# Patient Record
Sex: Male | Born: 1950
Health system: Southern US, Community
[De-identification: ages and names within clinical notes are randomized; demographics above are authoritative.]

## PROBLEM LIST (undated history)

## (undated) DIAGNOSIS — F32A Depression, unspecified: Secondary | ICD-10-CM

## (undated) DIAGNOSIS — K589 Irritable bowel syndrome without diarrhea: Secondary | ICD-10-CM

## (undated) DIAGNOSIS — G459 Transient cerebral ischemic attack, unspecified: Secondary | ICD-10-CM

## (undated) DIAGNOSIS — N200 Calculus of kidney: Secondary | ICD-10-CM

## (undated) DIAGNOSIS — E785 Hyperlipidemia, unspecified: Secondary | ICD-10-CM

## (undated) DIAGNOSIS — F419 Anxiety disorder, unspecified: Secondary | ICD-10-CM

## (undated) DIAGNOSIS — Z720 Tobacco use: Secondary | ICD-10-CM

## (undated) DIAGNOSIS — I10 Essential (primary) hypertension: Secondary | ICD-10-CM

## (undated) DIAGNOSIS — Z87442 Personal history of urinary calculi: Secondary | ICD-10-CM

## (undated) DIAGNOSIS — R7303 Prediabetes: Secondary | ICD-10-CM

## (undated) DIAGNOSIS — C61 Malignant neoplasm of prostate: Secondary | ICD-10-CM

## (undated) DIAGNOSIS — R972 Elevated prostate specific antigen [PSA]: Secondary | ICD-10-CM

## (undated) DIAGNOSIS — M1990 Unspecified osteoarthritis, unspecified site: Secondary | ICD-10-CM

## (undated) DIAGNOSIS — F329 Major depressive disorder, single episode, unspecified: Secondary | ICD-10-CM

## (undated) DIAGNOSIS — Z972 Presence of dental prosthetic device (complete) (partial): Secondary | ICD-10-CM

## (undated) HISTORY — DX: Major depressive disorder, single episode, unspecified: F32.9

## (undated) HISTORY — DX: Calculus of kidney: N20.0

## (undated) HISTORY — PX: PROSTATE BIOPSY: SHX241

## (undated) HISTORY — DX: Irritable bowel syndrome, unspecified: K58.9

## (undated) HISTORY — PX: OTHER SURGICAL HISTORY: SHX169

## (undated) HISTORY — DX: Tobacco use: Z72.0

## (undated) HISTORY — DX: Depression, unspecified: F32.A

## (undated) HISTORY — DX: Essential (primary) hypertension: I10

## (undated) HISTORY — PX: ROTATOR CUFF REPAIR: SHX139

## (undated) HISTORY — DX: Hyperlipidemia, unspecified: E78.5

---
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
## 2000-01-15 ENCOUNTER — Encounter: Payer: Self-pay | Admitting: Emergency Medicine

## 2000-01-15 ENCOUNTER — Emergency Department (HOSPITAL_COMMUNITY): Admission: EM | Admit: 2000-01-15 | Discharge: 2000-01-16 | Payer: Self-pay | Admitting: Emergency Medicine

## 2000-01-18 ENCOUNTER — Emergency Department (HOSPITAL_COMMUNITY): Admission: EM | Admit: 2000-01-18 | Discharge: 2000-01-18 | Payer: Self-pay | Admitting: Emergency Medicine

## 2000-01-22 ENCOUNTER — Ambulatory Visit (HOSPITAL_COMMUNITY): Admission: RE | Admit: 2000-01-22 | Discharge: 2000-01-22 | Payer: Self-pay | Admitting: Urology

## 2000-01-22 ENCOUNTER — Encounter: Payer: Self-pay | Admitting: Urology

## 2000-03-08 ENCOUNTER — Encounter: Payer: Self-pay | Admitting: Urology

## 2000-03-08 ENCOUNTER — Ambulatory Visit (HOSPITAL_COMMUNITY): Admission: RE | Admit: 2000-03-08 | Discharge: 2000-03-08 | Payer: Self-pay | Admitting: Urology

## 2000-10-13 ENCOUNTER — Encounter: Payer: Self-pay | Admitting: Internal Medicine

## 2000-10-13 ENCOUNTER — Emergency Department (HOSPITAL_COMMUNITY): Admission: EM | Admit: 2000-10-13 | Discharge: 2000-10-13 | Payer: Self-pay | Admitting: Emergency Medicine

## 2000-10-14 ENCOUNTER — Ambulatory Visit (HOSPITAL_COMMUNITY): Admission: RE | Admit: 2000-10-14 | Discharge: 2000-10-14 | Payer: Self-pay | Admitting: Urology

## 2000-10-14 ENCOUNTER — Encounter: Payer: Self-pay | Admitting: Urology

## 2000-10-28 ENCOUNTER — Encounter: Payer: Self-pay | Admitting: Urology

## 2000-10-28 ENCOUNTER — Ambulatory Visit (HOSPITAL_COMMUNITY): Admission: RE | Admit: 2000-10-28 | Discharge: 2000-10-28 | Payer: Self-pay | Admitting: Urology

## 2002-12-26 ENCOUNTER — Observation Stay (HOSPITAL_COMMUNITY): Admission: EM | Admit: 2002-12-26 | Discharge: 2002-12-26 | Payer: Self-pay | Admitting: Emergency Medicine

## 2002-12-26 ENCOUNTER — Encounter: Payer: Self-pay | Admitting: Emergency Medicine

## 2003-06-02 DIAGNOSIS — Z86010 Personal history of colonic polyps: Secondary | ICD-10-CM | POA: Insufficient documentation

## 2004-12-10 ENCOUNTER — Encounter: Admission: RE | Admit: 2004-12-10 | Discharge: 2004-12-10 | Payer: Self-pay | Admitting: Family Medicine

## 2005-03-02 ENCOUNTER — Encounter: Admission: RE | Admit: 2005-03-02 | Discharge: 2005-03-02 | Payer: Self-pay | Admitting: Occupational Medicine

## 2006-05-20 ENCOUNTER — Ambulatory Visit: Payer: Self-pay | Admitting: Family Medicine

## 2006-06-02 ENCOUNTER — Ambulatory Visit: Payer: Self-pay | Admitting: Family Medicine

## 2006-07-05 ENCOUNTER — Ambulatory Visit: Payer: Self-pay | Admitting: Family Medicine

## 2006-07-22 ENCOUNTER — Ambulatory Visit: Payer: Self-pay | Admitting: Family Medicine

## 2006-08-17 ENCOUNTER — Ambulatory Visit: Payer: Self-pay | Admitting: Internal Medicine

## 2006-08-17 LAB — CONVERTED CEMR LAB
Alkaline Phosphatase: 81 units/L (ref 39–117)
Calcium: 9.7 mg/dL (ref 8.4–10.5)
Chloride: 100 meq/L (ref 96–112)
Creatinine, Ser: 0.9 mg/dL (ref 0.4–1.5)
Eosinophils Absolute: 0.2 10*3/uL (ref 0.0–0.6)
GFR calc non Af Amer: 93 mL/min
Glucose, Bld: 95 mg/dL (ref 70–99)
Lymphocytes Relative: 27.8 % (ref 12.0–46.0)
MCHC: 33.1 g/dL (ref 30.0–36.0)
Neutrophils Relative %: 60.1 % (ref 43.0–77.0)
Platelets: 384 10*3/uL (ref 150–400)
Total Bilirubin: 0.6 mg/dL (ref 0.3–1.2)
Total Protein: 7.1 g/dL (ref 6.0–8.3)
WBC: 10 10*3/uL (ref 4.5–10.5)

## 2006-09-06 ENCOUNTER — Ambulatory Visit: Payer: Self-pay | Admitting: Internal Medicine

## 2006-09-06 ENCOUNTER — Encounter (INDEPENDENT_AMBULATORY_CARE_PROVIDER_SITE_OTHER): Payer: Self-pay | Admitting: Specialist

## 2006-10-26 ENCOUNTER — Ambulatory Visit: Payer: Self-pay | Admitting: Family Medicine

## 2007-12-15 ENCOUNTER — Telehealth: Payer: Self-pay | Admitting: Internal Medicine

## 2007-12-23 ENCOUNTER — Ambulatory Visit: Payer: Self-pay | Admitting: Internal Medicine

## 2007-12-23 DIAGNOSIS — K625 Hemorrhage of anus and rectum: Secondary | ICD-10-CM

## 2007-12-23 DIAGNOSIS — F1010 Alcohol abuse, uncomplicated: Secondary | ICD-10-CM | POA: Insufficient documentation

## 2007-12-23 DIAGNOSIS — R1033 Periumbilical pain: Secondary | ICD-10-CM | POA: Insufficient documentation

## 2007-12-26 ENCOUNTER — Encounter: Payer: Self-pay | Admitting: Internal Medicine

## 2007-12-26 ENCOUNTER — Ambulatory Visit: Payer: Self-pay | Admitting: Internal Medicine

## 2007-12-26 LAB — CONVERTED CEMR LAB
ALT: 68 units/L — ABNORMAL HIGH (ref 0–53)
AST: 51 units/L — ABNORMAL HIGH (ref 0–37)
Amylase: 148 units/L — ABNORMAL HIGH (ref 27–131)
CO2: 27 meq/L (ref 19–32)
Calcium: 9.5 mg/dL (ref 8.4–10.5)
Chloride: 101 meq/L (ref 96–112)
GFR calc non Af Amer: 92 mL/min
Hemoglobin: 16.9 g/dL (ref 13.0–17.0)
Lipase: 29 units/L (ref 11.0–59.0)
Lymphocytes Relative: 38.9 % (ref 12.0–46.0)
Monocytes Relative: 7 % (ref 3.0–12.0)
Neutro Abs: 3.8 10*3/uL (ref 1.4–7.7)
Platelets: 403 10*3/uL — ABNORMAL HIGH (ref 150–400)
Potassium: 4.1 meq/L (ref 3.5–5.1)
RBC: 5.7 M/uL (ref 4.22–5.81)
RDW: 13.3 % (ref 11.5–14.6)
Sodium: 138 meq/L (ref 135–145)

## 2007-12-29 ENCOUNTER — Telehealth: Payer: Self-pay | Admitting: Internal Medicine

## 2008-01-31 ENCOUNTER — Ambulatory Visit: Payer: Self-pay | Admitting: Internal Medicine

## 2008-04-02 ENCOUNTER — Ambulatory Visit: Payer: Self-pay | Admitting: Family Medicine

## 2008-04-04 ENCOUNTER — Encounter: Admission: RE | Admit: 2008-04-04 | Discharge: 2008-04-04 | Payer: Self-pay | Admitting: Family Medicine

## 2009-09-23 ENCOUNTER — Encounter (INDEPENDENT_AMBULATORY_CARE_PROVIDER_SITE_OTHER): Payer: Self-pay | Admitting: *Deleted

## 2010-06-30 ENCOUNTER — Ambulatory Visit
Admission: RE | Admit: 2010-06-30 | Discharge: 2010-06-30 | Payer: Self-pay | Source: Home / Self Care | Attending: Internal Medicine | Admitting: Internal Medicine

## 2010-06-30 ENCOUNTER — Other Ambulatory Visit: Payer: Self-pay | Admitting: Internal Medicine

## 2010-06-30 ENCOUNTER — Encounter: Payer: Self-pay | Admitting: Internal Medicine

## 2010-06-30 DIAGNOSIS — R143 Flatulence: Secondary | ICD-10-CM

## 2010-06-30 DIAGNOSIS — R141 Gas pain: Secondary | ICD-10-CM | POA: Insufficient documentation

## 2010-06-30 DIAGNOSIS — F411 Generalized anxiety disorder: Secondary | ICD-10-CM | POA: Insufficient documentation

## 2010-06-30 DIAGNOSIS — Z860100 Personal history of colon polyps, unspecified: Secondary | ICD-10-CM | POA: Insufficient documentation

## 2010-06-30 DIAGNOSIS — R142 Eructation: Secondary | ICD-10-CM

## 2010-06-30 DIAGNOSIS — K5730 Diverticulosis of large intestine without perforation or abscess without bleeding: Secondary | ICD-10-CM | POA: Insufficient documentation

## 2010-06-30 DIAGNOSIS — F329 Major depressive disorder, single episode, unspecified: Secondary | ICD-10-CM | POA: Insufficient documentation

## 2010-06-30 DIAGNOSIS — K649 Unspecified hemorrhoids: Secondary | ICD-10-CM | POA: Insufficient documentation

## 2010-06-30 DIAGNOSIS — G4730 Sleep apnea, unspecified: Secondary | ICD-10-CM | POA: Insufficient documentation

## 2010-06-30 LAB — COMPREHENSIVE METABOLIC PANEL
ALT: 34 U/L (ref 0–53)
Alkaline Phosphatase: 67 U/L (ref 39–117)
BUN: 10 mg/dL (ref 6–23)
Calcium: 9.4 mg/dL (ref 8.4–10.5)
Chloride: 102 mEq/L (ref 96–112)
Sodium: 137 mEq/L (ref 135–145)
Total Bilirubin: 0.7 mg/dL (ref 0.3–1.2)
Total Protein: 7 g/dL (ref 6.0–8.3)

## 2010-06-30 LAB — CBC WITH DIFFERENTIAL/PLATELET
Basophils Absolute: 0 10*3/uL (ref 0.0–0.1)
Basophils Relative: 0.5 % (ref 0.0–3.0)
HCT: 45.9 % (ref 39.0–52.0)
Hemoglobin: 15.6 g/dL (ref 13.0–17.0)
MCV: 83.8 fl (ref 78.0–100.0)
Neutro Abs: 6.1 10*3/uL (ref 1.4–7.7)

## 2010-07-01 NOTE — Letter (Signed)
Summary: Colonoscopy Letter  [facility]  [address] [place], [place] [address]   Phone: [phone]  Fax: [phone]      September 23, 2009 MRN: [id number]   [facility] [address] [place], [place]  [address]   Dear Mr. Jarrard,   According to your medical record, it is time for you to schedule a Colonoscopy. The American Cancer Society recommends this procedure as a method to detect early colon cancer. Patients with a family history of colon cancer, or a personal history of colon polyps or inflammatory bowel disease are at increased risk.  This letter has beeen generated based on the recommendations made at the time of your procedure. If you feel that in your particular situation this may no longer apply, please contact our office.  Please call our office at [phone] to schedule this appointment or to update your records at your earliest convenience.  Thank you for cooperating with [place] to provide you with the very best care possible.   Sincerely,  Iva Boop, M.D.  [facility] Gastroenterology Division [phone]

## 2010-07-09 NOTE — Assessment & Plan Note (Signed)
Summary: abd pain--ch.    History of Present Illness Visit Type: Follow-up Visit Primary GI MD: Stan Head MD [facility] Primary Provider: Sharlot Gowda MD Requesting Provider: na Chief Complaint: lower abd pain  History of Present Illness:   60 yo African-American man c/o chronic mostly nocturnal borborygmi and bloating that interferes with sleep. He will also have sharp pains also, short-lived but bothersome. No daytime issues. Has irregular defecation, rare rectal bleeding on stool and toilet paper (chronic). Diet: 4 cups cofee in Am then an evening eal. Not mch other eating.   GI Review of Systems    Reports abdominal pain and  bloating.     Location of  Abdominal pain: lower abdomen.    Denies acid reflux, belching, chest pain, dysphagia with liquids, dysphagia with solids, heartburn, loss of appetite, nausea, vomiting, vomiting blood, weight loss, and  weight gain.        Denies anal fissure, black tarry stools, change in bowel habit, constipation, diarrhea, diverticulosis, fecal incontinence, heme positive stool, hemorrhoids, irritable bowel syndrome, jaundice, light color stool, liver problems, rectal bleeding, and  rectal pain. Preventive Screening-Counseling & Management  Alcohol-Tobacco     Alcohol drinks/day: <1     Smoking Status: current     Smoking Cessation Counseling: yes     Smoke Cessation Stage: precontemplative     Tobacco Counseling: not indicated; no tobacco use  Caffeine-Diet-Exercise     Caffeine use/day: 4     Caffeine Counseling: decrease use of caffeine     Diet Comments: inadequate     Diet Counseling: to improve diet; diet is suboptimal     Does Patient Exercise: yes     Type of exercise: walking, active jon     Times/week: 5     Exercise Counseling: not indicated; exercise is adequate    Current Medications (verified): 1)  None  Allergies (verified): No Known Drug Allergies  Past History:  Past Medical History: Anxiety  Disorder Adenomatous Colon Polyps Depression Sleep Apnea Diverticulosis Hemorrhoids  Past Surgical History: Reviewed history from 12/23/2007 and no changes required. Unremarkable  Family History: Alcoholism: Father Family History of Breast Cancer: Mother, Sister Family History of Prostate Cancer: Father, Uncle Family History of Heart Disease: Father No FH of Colon Cancer:  Social History: Occupation: retired Patient currently smokes. 1 ppd Alcohol Use - only every 2 weeks now "a little red wine" Daily Caffeine Use 4 per day Illicit Drug Use - no Patient does not get regular exercise.  Caffeine use/day:  4 Diet Comments:  inadequate Does Patient Exercise:  yes Alcohol drinks/day:  <1   Vital Signs:  Patient profile:   60 year old male Height:      67 inches Weight:      161 pounds BMI:     25.31 BSA:     1.85 Pulse rate:   76 / minute Pulse rhythm:   regular BP sitting:   126 / 74  (left arm) Cuff size:   regular  Vitals Entered By: Ok Anis CMA (June 30, 2010 9:42 AM)  Physical Exam  General:  Well developed, well nourished, no acute distress. Eyes:  anicteric Lungs:  Clear throughout to auscultation. Heart:  Regular rate and rhythm; no murmurs, rubs,  or bruits. Abdomen:  Soft, nontender and nondistended. No masses, hepatosplenomegaly or hernias noted. Normal bowel sounds. Rectal:  deferred until time of colonoscopy.   Cervical Nodes:  No significant cervical or supraclavicular adenopathy.  Psych:  Alert and cooperative. Normal mood  and affect.   Impression & Recommendations:  Problem # 1:  ABDOMINAL PAIN-PERIUMBILICAL (ICD-789.05) Assessment Unchanged Similar to what he has had in past. Sounds like it is IBS. He reports greatly reducing alcohol so must not be that. Needs colonoscopy for screening and surveillance. Once that is done and labs reviewed, weill make recommendations. Have already given gas and flatulence prevention diet.  Problem #  2:  FLATULENCE ERUCTATION AND GAS PAIN (ICD-787.3) Assessment: Unchanged lIO am suspecting IBS. Diet changed to start (gas and flatulence reduction). Await screening/surveillance colonoscopy also.  Problem # 3:  ? of IRRITABLE BOWEL SYNDROME (ICD-564.1) Assessment: New  Problem # 4:  COLONIC POLYPS, ADENOMATOUS, HX OF (ICD-V12.72) Risks, benefits,and indications of endoscopic procedure(s) were reviewed with the patient and all questions answered.  Orders: Colonoscopy (Colon)  Problem # 5:  SPECIAL SCREENING FOR MALIGNANT NEOPLASMS COLON (ICD-V76.51) Assessment: Comment Only  Other Orders: TLB-TSH (Thyroid Stimulating Hormone) (84443-TSH) TLB-CMP (Comprehensive Metabolic Pnl) (80053-COMP) TLB-CBC Platelet - w/Differential (85025-CBCD)  Patient Instructions: 1)  Copy sent to : Sharlot Gowda MD 2)  You need to stop smoking, as we discussed. 3)  Your physician has requested that you have the following labwork done today: CBC, CMET, TSH 4)  You will go to the basement for labs today 5)  You colonoscopy is scheduled for 07/25/2010 at 3:30pm 6)  You will need to pick up your MoviPrep at your pharmacy today 7)  Gas and Flatulence diet given 8)  The medication list was reviewed and reconciled.  All changed / newly prescribed medications were explained.  A complete medication list was provided to the patient / caregiver.

## 2010-07-09 NOTE — Letter (Signed)
Summary: [facility] Instructions  [facility]  [address] [place], [place] [address]   Phone: [phone]  Fax: [phone]       Connor Stevens    May 02, 1951    MRN: [id number]        Procedure Day /Date:FRIDAY 07/25/2010     Arrival Time:2:30PM     Procedure Time:3:30PM     Location of Procedure:                    X  [facility] (4th Floor)                        PREPARATION FOR COLONOSCOPY WITH MOVIPREP   Starting 5 days prior to your procedure 07/20/2010  do not eat nuts, seeds, popcorn, corn, beans, peas,  salads, or any raw vegetables.  Do not take any fiber supplements (e.g. Metamucil, Citrucel, and Benefiber).  THE DAY BEFORE YOUR PROCEDURE         DATE:07/24/2010  DAY: THURSDAY  1.  Drink clear liquids the entire day-NO SOLID FOOD  2.  Do not drink anything colored red or purple.  Avoid juices with pulp.  No orange juice.  3.  Drink at least 64 oz. (8 glasses) of fluid/clear liquids during the day to prevent dehydration and help the prep work efficiently.  CLEAR LIQUIDS INCLUDE: Water Jello Ice Popsicles Tea (sugar ok, no milk/cream) Powdered fruit flavored drinks Coffee (sugar ok, no milk/cream) Gatorade Juice: apple, white grape, white cranberry  Lemonade Clear bullion, consomm, broth Carbonated beverages (any kind) Strained chicken noodle soup Hard Candy                             4.  In the morning, mix first dose of MoviPrep solution:    Empty 1 Pouch A and 1 Pouch B into the disposable container    Add lukewarm drinking water to the top line of the container. Mix to dissolve    Refrigerate (mixed solution should be used within 24 hrs)  5.  Begin drinking the prep at 5:00 p.m. The MoviPrep container is divided by 4 marks.   Every 15 minutes drink the solution down to the next mark (approximately 8 oz) until the full liter is complete.   6.  Follow completed prep with 16 oz of clear liquid of your choice (Nothing red  or purple).  Continue to drink clear liquids until bedtime.  7.  Before going to bed, mix second dose of MoviPrep solution:    Empty 1 Pouch A and 1 Pouch B into the disposable container    Add lukewarm drinking water to the top line of the container. Mix to dissolve    Refrigerate  THE DAY OF YOUR PROCEDURE      DATE:07/25/2010  DAY: FRIDAY  Beginning at 10:30a.m. (5 hours before procedure):         1. Every 15 minutes, drink the solution down to the next mark (approx 8 oz) until the full liter is complete.  2. Follow completed prep with 16 oz. of clear liquid of your choice.    3. You may drink clear liquids until 1:30PM  (2 HOURS BEFORE PROCEDURE).   MEDICATION INSTRUCTIONS  Unless otherwise instructed, you should take regular prescription medications with a small sip of water   as early as possible the morning of your procedure.  OTHER INSTRUCTIONS  You will need a responsible adult at least 60 years of age to accompany you and drive you home.   This person must remain in the waiting room during your procedure.  Wear loose fitting clothing that is easily removed.  Leave jewelry and other valuables at home.  However, you may wish to bring a book to read or  an iPod/MP3 player to listen to music as you wait for your procedure to start.  Remove all body piercing jewelry and leave at home.  Total time from sign-in until discharge is approximately 2-3 hours.  You should go home directly after your procedure and rest.  You can resume normal activities the  day after your procedure.  The day of your procedure you should not:   Drive   Make legal decisions   Operate machinery   Drink alcohol   Return to work  You will receive specific instructions about eating, activities and medications before you leave.    The above instructions have been reviewed and explained to me by   _______________________    I fully understand and can verbalize these  instructions _____________________________ Date _________

## 2010-07-25 ENCOUNTER — Other Ambulatory Visit: Payer: Self-pay | Admitting: Internal Medicine

## 2010-10-17 NOTE — H&P (Signed)
NAME:  OVE, AMAT NO.:  [id number]   MEDICAL RECORD NO.:  [id number]                   PATIENT TYPE:  INP   LOCATION:  0105                                 FACILITY:  [facility]   PHYSICIAN:  Elliot Cousin, M.D.                 DATE OF BIRTH:  Jun 04, 1950   DATE OF ADMISSION:  12/26/2002  DATE OF DISCHARGE:                                HISTORY & PHYSICAL   CHIEF COMPLAINT:  Substernal chest pain.   HISTORY OF PRESENT ILLNESS:  Mr. Morita is a 60 year old man who has a history  of depression and anxiety who presented to the emergency department today of  chest pain while at work.  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  He  rated the pain as an 8/10 in intensity.  The pain is described as tight,  indigestion-like, and pressure like.  The pain radiated to both shoulders  and also to the epigastric region.  He had no associated lightheadedness or  nausea.  However, he did have an episode of diaphoresis.  The patient also  had an episode of chest pain approximately one week ago, again while at  work.  He was standing up at the time.  His pain was described then as a  sharp pain, and he became lightheaded during that episode but no other  associated symptoms.  The pain lasted approximately 10 minutes last week and  then it resolved spontaneously.  The chest pain today lasted approximately  15-20 minutes, then it subsided on his way to the emergency department.   The patient has a recent history of an upper respiratory infection with a  productive cough of green sputum and subjective fevers and chills.  However,  he said that he was over this.  He has no complaints of pleurisy, no  complaints of heartburn and no complaints of chest wall muscle pain.  However, he states that he has been under a lot more stress lately.  He  takes Zoloft for depression and anxiety.  Currently the  patient is chest  pain-free.   REVIEW OF SYSTEMS:  The patient's review of systems is positive for recent  upper respiratory symptoms, subjective fever and chills, anxiety, mild  depression, and occasional urinary hesitancy.  His review of systems is  negative for headache, visual changes, weight loss, shortness of breath,  abdominal pain, nausea, vomiting, diarrhea, blood in the stools or painful  urination.   PAST MEDICAL HISTORY:  1. Depression.  No history of suicidal attempts.  2. History of benign prostatic hypertrophy.  3. History of left kidney stone.  4. History of colon polyps, status post polypectomy x2 in the past.   The patient has no history of hypertension, diabetes, cancer or lung  disease.   MEDICATIONS:  Zoloft  50 mg every day.   ALLERGIES:  No known drug allergies.   FAMILY HISTORY:  The patient has no family history of heart disease.  His  father is 5 years old, currently living and having kidney failure, bone  cancer and lung cancer.  His mother died of breast cancer at 12 years of  age.  He originally had seven brothers and two sisters.  One brother died of  a stroke, and the second brother died of lung cancer.   SOCIAL HISTORY:  The patient is divorced.  He lives in [place].  He has  two children.  He is employed full time.  He drinks alcohol approximately  two shots of scotch and beer four to five days per week.  He smokes one pack  of cigarettes per day, and he has been doing so for 30 years.  He denies  illicit drug use.   PHYSICAL EXAMINATION:  VITAL SIGNS:  Temperature is 98.0, pulse is 69,  respiratory rate 18, blood pressure 134/93.  Oxygen saturation on room air  100%.  GENERAL:  The patient is a pleasant middle age [place] American man who is  lying in bed in no acute distress.  HEENT:  Head is normocephalic, atraumatic.  Pupils are equal, round and  reactive to light.  Extraocular movements are intact.  Conjunctivae are  clear.  Sclerae  are white.  Tympanic membranes are clear bilaterally.  Nasal  mucosa is moist without any drainage.  Oropharynx reveals moist mucous  membranes, no posterior exudates or erythema.  Fair dentition.  NECK:  Supple.  No adenopathy.  No thyromegaly, no bruits.  LUNGS:  Clear to auscultation bilaterally.  HEART:  S1 and S2 with no murmurs, rubs or gallops.  ABDOMEN:  Slightly obese, positive bowel sounds, soft, nontender,  nondistended.  No hepatosplenomegaly.  RECTAL/GENITOURINARY:  Deferred.  EXTREMITIES:  The patient has 2+ pedal pulses bilaterally.  No pretibial  edema.  NEUROLOGICAL:  The patient is alert and oriented x 3.  Cranial nerves II-XII  are intact.  Strength is 5/5 throughout.  Sensation is intact throughout.  PSYCHOLOGICAL:  The patient is pleasant, alert and oriented.  However, he  does appear slightly anxious.   LABORATORY DATA:  Initial laboratories:  EKG normal sinus rhythm without any  acute changes.  Chest x-ray:  No acute abnormalities.  CK:  276, CK-MB 3.4,  index 1.2 (0-2.5 within normal limits).  Troponin-I 0.03.  Lipase 19.  WBC  6.5, hemoglobin 14.9, hematocrit 44.4, MCV 82.4, platelets 319,000.  Sodium  139, potassium 4.8, chloride 103, CO2 29, glucose 111, BUN 9, creatinine  1.1, calcium 9.5, total protein 7.2, albumin 4.4, AST 29, ALT 33, alkaline  phosphatase 57, total bilirubin 0.9.   ASSESSMENT:  1. Substernal chest pain.  The chest pain is atypical for  angina, however,     the patient does have significant risk factor and tobacco abuse.  He does     not have a strong family history of heart disease, nor does he have a     history of high blood pressure or hyperlipidemia per his history.     Consider anxiety/stress as an etiology, gastroesophageal reflux disease,     and chest wall pain as other etiologies.  Less likely pulmonary embolism     given the patient is oxygenating 100% on room air.  He has no pleurisy.    The patient's first set of cardiac  enzymes are relatively unremarkable  with the exception of a total CK that was mildly elevated at 276.  2. Tobacco abuse and alcohol abuse.   PLAN:  1. The patient will be admitted for 24 hour observation to rule out     myocardial infarction.  We will obtain a cardiology consult for possible     inpatient versus outpatient stress testing.  The patient will be observed     on the telemetry bed.  Cardiac enzymes will be collected every 8 hours     x3.  2. Sublingual nitroglycerin 0.4 mg sublingually every 5 minutes x3 as needed     for chest pain, morphine 2 mg IV q.4h. as needed for pain and aspirin 81     mg every day.  3. Will add empiric Protonics 40 mg every day and Mylanta 15 mL every four     hours as needed for possible reflux.  4. We will continue Zoloft at 50 mg every day and add Xanax 0.25 mg every 6     hours as needed for anxiety and     sleep.  5. Obtain a fasting lipid panel in the morning.  6. Advise the patient to reduce his intake of alcohol and/or stop his     tobacco use.                                               Elliot Cousin, M.D.    DF/MEDQ  D:  12/26/2002  T:  12/26/2002  Job:  [id number]   cc:   Sharlot Gowda, M.D.  [address] [place], [place] [address]  Fax: [phone]

## 2010-10-17 NOTE — Assessment & Plan Note (Signed)
[name]                         GASTROENTEROLOGY OFFICE NOTE   Connor Stevens, Connor Stevens                           MRN:          [id number]  DATE:08/17/2006                            DOB:          1950-07-04    CHIEF COMPLAINT:  Stomach pain.   HISTORY:  [age]-year-old African American man that has several  complaints.  He has developed some infraumbilical and periumbilical  abdominal pain described as gas.  He passes a lot of flatus and feels  somewhat better.  It is worse when he lies down.  He quit drinking about  2 weeks ago and symptoms are a little bit less, but still a problem.  Two months ago, he had some dark red blood when he was wiping, or it was  in his bowel movement.  He has not had melena of which I am aware.  The  abdominal pain is relatively constant, not made worse by drinking or  eating, etc.  Again, he feels somewhat better since stopping alcohol.  He has been a regular drinker of liquor and beer everyday, perhaps about  8 ounces of liquor everyday it sounds like and a couple of beers.  He  has been under a lot of stress lately and wonders if there is a  relation.  He retired in December and is planning on looking for some  other work.  He was on Zoloft 25 mg daily for premature ejaculation,  coordinated through Dr. Brunilda Payor and Dr. Susann Givens, increased up to 100 mg a  day in December.  He has constipation more so than loose stools, though  he can have both.  He uses some Tums for heartburn at times.  This  morning, he noted some right-sided chest wall pain after bending over;  it sounds like he has been doing some toe-touches.  He has been trying  to exercise a little bit and he has some lower back tenderness as well.   PAST MEDICAL HISTORY:  1. Multiple colonoscopies for adenomatous colon polyps; last one was      in 2005 with an 8-mm adenomatous polyp removed and another one      cauterized to destruction, which was about 4 mm (Dr.  Corinda Gubler).  He      has had polyps throughout the years, dating back to procedure of      1989, when he first met Dr. Corinda Gubler.  2. Anxiety.  3. Depression, relatively new.  4. He indicates a diagnosis of sleep apnea.   MEDICATIONS:  1. Sertraline 100 mg daily.  2. Multivitamin daily.  3. Zolpidem p.r.n. insomnia.   ALLERGIES:  None known.   FAMILY HISTORY:  An uncle and a father had prostate cancer.  Mother and  sister had breast cancer.  Father was an alcoholic and father had heart  disease.   SOCIAL HISTORY:  The patient is retired from the post [employer].  He is  divorced.  He has 2 sons.  He smokes a pack per day; he has been  counseled as to the hazards of that and the need to quit.  Alcohol:  Pretty regular and steady for a number of years, as described.  He is a  Teaching laboratory technician as well.   REVIEW OF SYSTEMS:  Positive for insomnia and those things mentioned  above.   PHYSICAL EXAMINATION:  A well-developed, middle-aged black male in no  acute distress.  Height 5 feet 7 inches, weight 178 pounds.  Pulse 84 and regular.  Blood  pressure 114/70.  HEENT:  The eyes are anicteric.  EOMI.  PERRLA.  Sclerae are anicteric.  Conjunctivae are pink.  Teeth in fair repair.  NECK:  Supple without thyromegaly or mass.  CHEST:  Clear.  CARDIAC:  S1 and S2.  There are no murmurs, gallops or rubs.  ABDOMEN:  Soft, minimally tender to the right of the umbilicus.  No  hepatosplenomegaly.  Bowel sounds are normoactive.  RECTAL:  Deferred.  LYMPHATICS:  No neck or supraclavicular lymphadenopathy.  EXTREMITIES:  No edema.  SKIN:  No acute rash.  NEUROLOGIC/PSYCHIATRIC:  Alert and oriented x3.   ASSESSMENT:  This man has had some rectal bleeding and blood in the  stool.  He has periumbilical, mainly infraumbilical pain and  gaseousness.  He has perhaps some change in bowel habits.  There is some  mild heartburn symptomatology noted as well.  This is in the background  of a history of  adenomatous colon polyps over the years as well as  recent anxiety and depressive symptomatology and stressors and a major  life change of retirement.   PLAN:  1. Check a colonoscopy, given the bleeding and the history of polyps;      I want to exclude any serious causes such as colorectal cancer.  2. CBC and a CMET are checked today.  The patient has been a regular      drinker and I want to make sure he does not have alcoholic      hepatitis, though he does not seem to have hepatomegaly or anything      like that that would be worthwhile checking for.  3. Consider proton pump inhibitor.  4. Trial of Levsin SL p.r.n., #60, no refills prescribed pending the      results of the colonoscopy.   I appreciate the opportunity to care for this patient.     Iva Boop, MD,FACG  Electronically Signed    CEG/MedQ  DD: 08/17/2006  DT: 08/18/2006  Job #: [id number]

## 2010-10-27 ENCOUNTER — Emergency Department (HOSPITAL_COMMUNITY)
Admission: EM | Admit: 2010-10-27 | Discharge: 2010-10-27 | Disposition: A | Discharge status: Home / Self Care | Payer: 59 | Attending: Emergency Medicine | Admitting: Emergency Medicine

## 2010-10-27 ENCOUNTER — Emergency Department (HOSPITAL_COMMUNITY): Payer: 59

## 2010-10-27 DIAGNOSIS — R059 Cough, unspecified: Secondary | ICD-10-CM | POA: Insufficient documentation

## 2010-10-27 DIAGNOSIS — R51 Headache: Secondary | ICD-10-CM | POA: Insufficient documentation

## 2010-10-27 DIAGNOSIS — R05 Cough: Secondary | ICD-10-CM | POA: Insufficient documentation

## 2010-10-27 DIAGNOSIS — R079 Chest pain, unspecified: Secondary | ICD-10-CM | POA: Insufficient documentation

## 2010-10-27 DIAGNOSIS — J40 Bronchitis, not specified as acute or chronic: Secondary | ICD-10-CM | POA: Insufficient documentation

## 2010-10-27 DIAGNOSIS — J3489 Other specified disorders of nose and nasal sinuses: Secondary | ICD-10-CM | POA: Insufficient documentation

## 2010-10-27 DIAGNOSIS — R0602 Shortness of breath: Secondary | ICD-10-CM | POA: Insufficient documentation

## 2012-10-28 ENCOUNTER — Ambulatory Visit: Payer: Self-pay

## 2012-11-06 ENCOUNTER — Encounter (HOSPITAL_COMMUNITY): Payer: Self-pay

## 2012-11-06 ENCOUNTER — Emergency Department (HOSPITAL_COMMUNITY)
Admission: EM | Admit: 2012-11-06 | Discharge: 2012-11-06 | Disposition: A | Discharge status: Home / Self Care | Payer: 59 | Attending: Emergency Medicine | Admitting: Emergency Medicine

## 2012-11-06 ENCOUNTER — Emergency Department (HOSPITAL_COMMUNITY): Payer: 59

## 2012-11-06 DIAGNOSIS — Z791 Long term (current) use of non-steroidal anti-inflammatories (NSAID): Secondary | ICD-10-CM | POA: Insufficient documentation

## 2012-11-06 DIAGNOSIS — M549 Dorsalgia, unspecified: Secondary | ICD-10-CM | POA: Insufficient documentation

## 2012-11-06 LAB — URINALYSIS, ROUTINE W REFLEX MICROSCOPIC
Ketones, ur: NEGATIVE mg/dL
Leukocytes, UA: NEGATIVE
Nitrite: NEGATIVE
Protein, ur: NEGATIVE mg/dL
Urobilinogen, UA: 1 mg/dL (ref 0.0–1.0)

## 2012-11-06 LAB — CBC WITH DIFFERENTIAL/PLATELET
Basophils Absolute: 0.1 10*3/uL (ref 0.0–0.1)
Basophils Relative: 1 % (ref 0–1)
Eosinophils Absolute: 0.2 10*3/uL (ref 0.0–0.7)
Lymphs Abs: 3 10*3/uL (ref 0.7–4.0)
MCH: 26.5 pg (ref 26.0–34.0)
MCHC: 32.6 g/dL (ref 30.0–36.0)
Neutrophils Relative %: 54 % (ref 43–77)
Platelets: 315 10*3/uL (ref 150–400)
RBC: 5.32 MIL/uL (ref 4.22–5.81)
RDW: 14.5 % (ref 11.5–15.5)

## 2012-11-06 LAB — COMPREHENSIVE METABOLIC PANEL
AST: 33 U/L (ref 0–37)
CO2: 27 mEq/L (ref 19–32)
Calcium: 9.9 mg/dL (ref 8.4–10.5)
Creatinine, Ser: 0.87 mg/dL (ref 0.50–1.35)
GFR calc Af Amer: >90 mL/min (ref >90)
GFR calc non Af Amer: >90 mL/min (ref >90)
Sodium: 138 mEq/L (ref 135–145)
Total Protein: 7.4 g/dL (ref 6.0–8.3)

## 2012-11-06 MED ORDER — NAPROXEN 500 MG PO TABS: 500.0000 mg | ORAL_TABLET | Freq: Two times a day (BID) | ORAL | Status: DC

## 2012-11-06 MED ORDER — OXYCODONE-ACETAMINOPHEN 7.5-325 MG PO TABS: 1.0000 | ORAL_TABLET | ORAL | Status: DC | PRN

## 2012-11-06 NOTE — ED Provider Notes (Signed)
History     CSN: [id number]  Arrival date & time 11/06/12  1537   First MD Initiated Contact with Patient 11/06/12 1548      Chief Complaint  Patient presents with  . Flank Pain    (Consider location/radiation/quality/duration/timing/severity/associated sxs/prior treatment) HPI Patient presents to emergency room with 2-3 week history of increasing back pain associated with muscle spasms.  Pain seems to also radiate down into his thighs.  Pain is made much worse with movement bending or stooping.  Patient's had no fever or chills. Was seen at Urgent care and prescribed medications which hasn't helped much.  Patient has history of alcohol use.  Was told he had abnormal liver enzymes and to stop drinking alcohol.  History reviewed. No pertinent past medical history.  History reviewed. No pertinent past surgical history.  History reviewed. No pertinent family history.  History  Substance Use Topics  . Smoking status: Not on file  . Smokeless tobacco: Not on file  . Alcohol Use: Not on file      Review of Systems All other systems reviewed and are negative  Allergies  Review of patient's allergies indicates no known allergies.  Home Medications   Current Outpatient Rx  Name  Route  Sig  Dispense  Refill  . cyclobenzaprine (FLEXERIL) 10 MG tablet   Oral   Take 10 mg by mouth 2 (two) times daily as needed for muscle spasms.         . Multiple Vitamin (MULTIVITAMIN WITH MINERALS) TABS   Oral   Take 1 tablet by mouth daily.         . naproxen (NAPROSYN) 500 MG tablet   Oral   Take 1 tablet (500 mg total) by mouth 2 (two) times daily.   30 tablet   0   . oxyCODONE-acetaminophen (PERCOCET) 7.5-325 MG per tablet   Oral   Take 1 tablet by mouth every 4 (four) hours as needed for pain.   30 tablet   0     BP 132/82  Pulse 79  Temp(Src) 98.2 F (36.8 C) (Oral)  Resp 18  SpO2 100%  Physical Exam  Nursing note and vitals reviewed. Constitutional: He is  oriented to person, place, and time. He appears well-developed and well-nourished. No distress.  HENT:  Head: Normocephalic and atraumatic.  Eyes: Pupils are equal, round, and reactive to light.  Neck: Normal range of motion.  Cardiovascular: Normal rate and intact distal pulses.   Pulmonary/Chest: No respiratory distress.  Abdominal: Normal appearance. He exhibits no distension, no pulsatile liver, no fluid wave and no abdominal bruit. There is no tenderness. There is no rigidity, no rebound and no guarding.  Musculoskeletal:       Back:  Pain with movement or bending.  Some pain with palpation.  Neurological: He is alert and oriented to person, place, and time. No cranial nerve deficit.  Skin: Skin is warm and dry. No rash noted.  Psychiatric: He has a normal mood and affect. His behavior is normal.    ED Course  Procedures (including critical care time)  Labs Reviewed  URINALYSIS, ROUTINE W REFLEX MICROSCOPIC - Abnormal; Notable for the following:    Color, Urine AMBER (*)    All other components within normal limits  CBC WITH DIFFERENTIAL  COMPREHENSIVE METABOLIC PANEL   Dg Lumbar Spine Complete  11/06/2012   *RADIOLOGY REPORT*  Clinical Data: Low back pain radiating into both lower extremities.  LUMBAR SPINE - COMPLETE 4+ VIEW  Comparison:  None.  Findings: Mild spondylosis of the lumbar spine present with osteophyte formation at multiple levels and facet hypertrophy at multiple levels.  There is no evidence of fracture, listhesis or bony lesion.  Disc space heights are relatively well preserved with no significant disc space narrowing present.  IMPRESSION: Mild lumbar spondylosis.  No acute findings.   Original Report Authenticated By: Irish Lack, M.D.     1. Back pain       MDM          Nelia Shi, MD 11/07/12 [phone]

## 2012-11-06 NOTE — ED Notes (Signed)
He c/o bilat. Flank pain radiating into back x 3 weeks.  He was seen at a minor emerg. And prescribed meds which are not helping.  He states sometimes he has "spasms" during which he feels the need to have a b.m., but is unable to.  He states he had a sm. Formed stool this afternoon.  He denies fever/n/v/dysuria.

## 2012-12-14 ENCOUNTER — Encounter: Payer: Self-pay | Admitting: Internal Medicine

## 2013-03-14 ENCOUNTER — Encounter: Payer: Self-pay | Admitting: Neurology

## 2013-03-15 ENCOUNTER — Ambulatory Visit (INDEPENDENT_AMBULATORY_CARE_PROVIDER_SITE_OTHER): Payer: 59 | Admitting: Neurology

## 2013-03-15 ENCOUNTER — Encounter (INDEPENDENT_AMBULATORY_CARE_PROVIDER_SITE_OTHER): Payer: Self-pay

## 2013-03-15 ENCOUNTER — Encounter: Payer: Self-pay | Admitting: Neurology

## 2013-03-15 VITALS — BP 143/81 | HR 73 | Ht 66.0 in | Wt 162.0 lb

## 2013-03-15 DIAGNOSIS — R209 Unspecified disturbances of skin sensation: Secondary | ICD-10-CM | POA: Insufficient documentation

## 2013-03-15 NOTE — Progress Notes (Signed)
Reason for visit: Left-sided numbness, jerking  Connor Stevens is a 62 y.o. male  History of present illness:  Connor Stevens is a 62 year old right-handed black male with a history of hypertension and dyslipidemia, and tobacco abuse. The patient has been medically noncompliant, followed through the [facility]. The patient was on medications for blood pressure and cholesterol, but he has not taken these medications in several years. The patient has not followed up for medical evaluation. The patient indicates that he has a long-term history of some left neck and shoulder and arm discomfort. Two weeks ago, the patient indicates that he is was feeling bad, and he went into a convenience store to buy something to help settle his stomach. The patient began having onset of left arm and left leg tremors or jerking. The patient did not fall, and he was able to hold on to something to keep from falling over. The patient indicated that the episode lasted about 30 seconds and resolved. The patient believes that there may been some left facial jerking as well, and some change in vision. The patient felt confused, but he did not black out. The patient did not lose control of the bowels or the bladder, and he did not bite his tongue. The patient had a second episode that same day involving only the left arm. The patient denied any headache. The patient went on his blood pressure and cholesterol medications after this, and he comes into the office today for an evaluation. The patient indicates that he did feel some palpitations of the heart during the episode. The patient has had some numbness of the left leg. The patient has not had an evaluation yet for the above event. No further recurrences have been noted. Just prior to the episodes described above, the patient lost hearing in both ears. The patient had some stomach discomfort prior to the event.  Past Medical History  Diagnosis Date  . Hypertension   . Dyslipidemia    . Tobacco abuse   . Depression     Past Surgical History  Procedure Laterality Date  . Colon polyp resection      Family History  Problem Relation Age of Onset  . Heart disease Father   . Cancer Mother   . Stroke Brother   . Lung cancer Brother     Social history:  reports that he has been smoking Cigarettes.  He has a 40 pack-year smoking history. He has never used smokeless tobacco. He reports that he drinks alcohol. He reports that he does not use illicit drugs.  Medications:  Current Outpatient Prescriptions on File Prior to Visit  Medication Sig Dispense Refill  . clotrimazole-betamethasone (LOTRISONE) cream Apply 1 application topically 2 (two) times daily.      . cyclobenzaprine (FLEXERIL) 10 MG tablet Take 10 mg by mouth 2 (two) times daily as needed for muscle spasms.      . naproxen (NAPROSYN) 500 MG tablet Take 1 tablet (500 mg total) by mouth 2 (two) times daily.  30 tablet  0  . oxyCODONE-acetaminophen (PERCOCET) 7.5-325 MG per tablet Take 1 tablet by mouth every 4 (four) hours as needed for pain.  30 tablet  0  . valACYclovir (VALTREX) 1000 MG tablet Take 1,000 mg by mouth daily.       No current facility-administered medications on file prior to visit.     No Known Allergies  ROS:  Out of a complete 14 system review of symptoms, the patient complains only  of the following symptoms, and all other reviewed systems are negative.  Fatigue Hearing loss, difficulty swallowing Blurred vision Cough Constipation Urination problems Joint pain, achy muscles  Allergies, runny nose Memory loss, confusion, numbness, weakness, slurred speech, difficulty swallowing Depression, anxiety, insomnia, decreased energy, change in appetite, suicidal thoughts, racing thoughts  Blood pressure 143/81, pulse 73, height 5\' 6"  (1.676 m), weight 162 lb (73.483 kg).  Physical Exam  General: The patient is alert and cooperative at the time of the examination.  Head: Pupils are  equal, round, and reactive to light. Discs are flat bilaterally.  Neck: The neck is supple, no carotid bruits are noted.  Respiratory: The respiratory examination is clear.  Cardiovascular: The cardiovascular examination reveals a regular rate and rhythm, no obvious murmurs or rubs are noted.  Skin: Extremities are without significant edema.  Neurologic Exam  Mental status:  Cranial nerves: Facial symmetry is present. There is good sensation of the face to pinprick and soft touch bilaterally. The strength of the facial muscles and the muscles to head turning and shoulder shrug are normal bilaterally. Speech is well enunciated, no aphasia or dysarthria is noted. Extraocular movements are full. Visual fields are full.  Motor: The motor testing reveals 5 over 5 strength of all 4 extremities. Good symmetric motor tone is noted throughout.  Sensory: Sensory testing is intact to pinprick, soft touch, vibration sensation, and position sense on all 4 extremities, with the exception that there is some decrease in pinprick sensation on the left leg below the knee, decreased vibration sensation in the left foot. No evidence of extinction is noted.  Coordination: Cerebellar testing reveals good finger-nose-finger and heel-to-shin bilaterally.  Gait and station: Gait is normal. Tandem gait is normal. Romberg is negative. No drift is seen.  Reflexes: Deep tendon reflexes are symmetric and normal bilaterally. Toes are downgoing bilaterally.   Assessment/Plan:  1. Episodes of left-sided jerking, tremor  2. Medical noncompliance  3. Dyslipidemia  4. Hypertension  5. Tobacco abuse  The patient does have risk factors for cerebrovascular disease, and further evaluation in this regard is needed. The patient may have suffered a focal seizure involving the left body. The patient will be set up for MRI evaluation of the brain, and MRA of the head. The patient will undergo EEG evaluation. A carotid  Doppler study will be done. The patient will go on low-dose aspirin, 81 mg daily. The patient will followup in 3 months. The patient needs regular primary care followup.  Marlan Palau MD 03/15/2013 8:00 PM  [facility] [address] [place], [place] [address]  Phone [phone] Fax [phone]

## 2013-03-20 ENCOUNTER — Other Ambulatory Visit: Payer: 59 | Admitting: Radiology

## 2013-03-22 ENCOUNTER — Ambulatory Visit (INDEPENDENT_AMBULATORY_CARE_PROVIDER_SITE_OTHER): Payer: 59 | Admitting: Radiology

## 2013-03-22 ENCOUNTER — Telehealth: Payer: Self-pay

## 2013-03-22 DIAGNOSIS — R209 Unspecified disturbances of skin sensation: Secondary | ICD-10-CM

## 2013-03-22 MED ORDER — LEVETIRACETAM 250 MG PO TABS: 250.0000 mg | ORAL_TABLET | Freq: Two times a day (BID) | ORAL | Status: DC

## 2013-03-22 NOTE — Procedures (Signed)
Connor Stevens is a 62 year old gentleman with a history of hypertension and dyslipidemia. The patient has had episodes of left arm and leg tremors. The patient indicates that he had left facial jerking as well. The patient had no loss of consciousness. The patient is being evaluated for these events.  This is a routine EEG. No skull defects are noted. Medications include amlodipine, Lipitor, Flexeril, Naprosyn, oxycodone, and Valtrex.  EEG classification: Dysrhythmia grade 2 left hemisphere  Description of the recording: The background rhythms of this recording consists of a well modulated medium amplitude alpha rhythm of 9 Hz that is reactive to eye opening and closure. As the record progresses, the patient appears to remain in the waking state throughout the recording. Photic stimulation is performed and this results in a bilateral photic driving response. Hyperventilation is also performed, and this results in a minimal buildup of the background rhythm activities without significant slowing seen. Intermittently during the recording, dysrhythmic theta activity is seen emanating from the left hemisphere, not present in the right. These episodes are seen several times during the recording. No spike or spike wave discharges are seen. EKG monitor shows no evidence of cardiac rhythm abnormalities with a heart rate of 72.  Impression: This is an abnormal EEG recording secondary to intermittent dysrhythmic theta slowing emanating from the left hemisphere. This study suggests a left brain abnormalities, and irritability. This suggests a lowered seizure threshold, although no electrographic seizures were recorded. This study does not correlate well with a left-sided motor event clinically.

## 2013-03-22 NOTE — Telephone Encounter (Signed)
Patient spoke with Fannie Knee.  Says he would like to take Xanax prior to getting the MRI.  Okay to dispense?  Please advise.  Thank you.

## 2013-03-22 NOTE — Telephone Encounter (Signed)
I called patient. EEG study showed some irritability and left brain, this does not correlate well with the left sided jerking episodes. MRI the brain will be done. I'll call in some Keppra for the patient, he may come and get alprazolam for the MRI scan.

## 2013-03-28 ENCOUNTER — Ambulatory Visit (INDEPENDENT_AMBULATORY_CARE_PROVIDER_SITE_OTHER): Payer: 59

## 2013-03-28 DIAGNOSIS — R209 Unspecified disturbances of skin sensation: Secondary | ICD-10-CM

## 2013-03-29 ENCOUNTER — Ambulatory Visit
Admission: RE | Admit: 2013-03-29 | Discharge: 2013-03-29 | Disposition: A | Discharge status: Home / Self Care | Payer: 59 | Source: Ambulatory Visit | Attending: Neurology | Admitting: Neurology

## 2013-03-29 DIAGNOSIS — R209 Unspecified disturbances of skin sensation: Secondary | ICD-10-CM

## 2013-03-30 ENCOUNTER — Telehealth: Payer: Self-pay | Admitting: Neurology

## 2013-03-30 NOTE — Telephone Encounter (Signed)
I called the patient. The carotid Doppler study was unremarkable. I reviewed the MRI the brain, and this shows mild small vessel disease, no acute changes are seen by my reading. The MRA of the head looks unremarkable by my reading. I'll contact the patient if the formal reading is different. The patient has had an abnormal EEG study, left-sided slowing that does not correlate with the episodes of left-sided jerking. The patient has been placed empirically on Keppra, and he'll followup in 2 or 3 months.

## 2013-07-07 ENCOUNTER — Ambulatory Visit: Payer: 59 | Admitting: Neurology

## 2014-04-18 ENCOUNTER — Encounter: Payer: Self-pay | Admitting: Neurology

## 2014-04-24 ENCOUNTER — Encounter: Payer: Self-pay | Admitting: Neurology

## 2014-11-24 ENCOUNTER — Emergency Department (HOSPITAL_COMMUNITY): Payer: 59

## 2014-11-24 ENCOUNTER — Emergency Department (HOSPITAL_COMMUNITY)
Admission: EM | Admit: 2014-11-24 | Discharge: 2014-11-24 | Disposition: A | Discharge status: Home / Self Care | Payer: 59 | Attending: Emergency Medicine | Admitting: Emergency Medicine

## 2014-11-24 ENCOUNTER — Encounter (HOSPITAL_COMMUNITY): Payer: Self-pay | Admitting: *Deleted

## 2014-11-24 DIAGNOSIS — R059 Cough, unspecified: Secondary | ICD-10-CM

## 2014-11-24 DIAGNOSIS — I10 Essential (primary) hypertension: Secondary | ICD-10-CM | POA: Diagnosis not present

## 2014-11-24 DIAGNOSIS — Z8659 Personal history of other mental and behavioral disorders: Secondary | ICD-10-CM | POA: Insufficient documentation

## 2014-11-24 DIAGNOSIS — E785 Hyperlipidemia, unspecified: Secondary | ICD-10-CM | POA: Diagnosis not present

## 2014-11-24 DIAGNOSIS — Z720 Tobacco use: Secondary | ICD-10-CM | POA: Insufficient documentation

## 2014-11-24 DIAGNOSIS — R000 Tachycardia, unspecified: Secondary | ICD-10-CM | POA: Diagnosis not present

## 2014-11-24 DIAGNOSIS — J159 Unspecified bacterial pneumonia: Secondary | ICD-10-CM | POA: Insufficient documentation

## 2014-11-24 DIAGNOSIS — R1110 Vomiting, unspecified: Secondary | ICD-10-CM | POA: Diagnosis present

## 2014-11-24 DIAGNOSIS — Z791 Long term (current) use of non-steroidal anti-inflammatories (NSAID): Secondary | ICD-10-CM | POA: Diagnosis not present

## 2014-11-24 DIAGNOSIS — Z79899 Other long term (current) drug therapy: Secondary | ICD-10-CM | POA: Diagnosis not present

## 2014-11-24 DIAGNOSIS — J189 Pneumonia, unspecified organism: Secondary | ICD-10-CM

## 2014-11-24 DIAGNOSIS — R05 Cough: Secondary | ICD-10-CM

## 2014-11-24 LAB — COMPREHENSIVE METABOLIC PANEL
ALBUMIN: 3.3 g/dL — AB (ref 3.5–5.0)
ALK PHOS: 89 U/L (ref 38–126)
ALT: 45 U/L (ref 17–63)
AST: 52 U/L — AB (ref 15–41)
Anion gap: 13 (ref 5–15)
BUN: 10 mg/dL (ref 6–20)
CHLORIDE: 95 mmol/L — AB (ref 101–111)
CO2: 24 mmol/L (ref 22–32)
CREATININE: 1.47 mg/dL — AB (ref 0.61–1.24)
Calcium: 8.9 mg/dL (ref 8.9–10.3)
GFR calc Af Amer: 56 mL/min — ABNORMAL LOW (ref >60)
GFR, EST NON AFRICAN AMERICAN: 49 mL/min — AB (ref >60)
GLUCOSE: 106 mg/dL — AB (ref 65–99)
POTASSIUM: 4.3 mmol/L (ref 3.5–5.1)
SODIUM: 132 mmol/L — AB (ref 135–145)
TOTAL PROTEIN: 6.6 g/dL (ref 6.5–8.1)
Total Bilirubin: 0.6 mg/dL (ref 0.3–1.2)

## 2014-11-24 LAB — URINE MICROSCOPIC-ADD ON

## 2014-11-24 LAB — CBC WITH DIFFERENTIAL/PLATELET
BASOS ABS: 0 10*3/uL (ref 0.0–0.1)
Basophils Relative: 0 % (ref 0–1)
EOS PCT: 0 % (ref 0–5)
Eosinophils Absolute: 0 10*3/uL (ref 0.0–0.7)
HCT: 36.6 % — ABNORMAL LOW (ref 39.0–52.0)
Hemoglobin: 12.4 g/dL — ABNORMAL LOW (ref 13.0–17.0)
LYMPHS ABS: 1 10*3/uL (ref 0.7–4.0)
Lymphocytes Relative: 8 % — ABNORMAL LOW (ref 12–46)
MCH: 27.4 pg (ref 26.0–34.0)
MCHC: 33.9 g/dL (ref 30.0–36.0)
MCV: 81 fL (ref 78.0–100.0)
MONO ABS: 0.6 10*3/uL (ref 0.1–1.0)
Monocytes Relative: 5 % (ref 3–12)
NEUTROS PCT: 87 % — AB (ref 43–77)
Neutro Abs: 10.8 10*3/uL — ABNORMAL HIGH (ref 1.7–7.7)
Platelets: 245 10*3/uL (ref 150–400)
RBC: 4.52 MIL/uL (ref 4.22–5.81)
RDW: 14.1 % (ref 11.5–15.5)
WBC: 12.4 10*3/uL — AB (ref 4.0–10.5)

## 2014-11-24 LAB — URINALYSIS, ROUTINE W REFLEX MICROSCOPIC
GLUCOSE, UA: NEGATIVE mg/dL
KETONES UR: NEGATIVE mg/dL
Leukocytes, UA: NEGATIVE
Nitrite: NEGATIVE
PROTEIN: 100 mg/dL — AB
Specific Gravity, Urine: 1.026 (ref 1.005–1.030)
Urobilinogen, UA: 0.2 mg/dL (ref 0.0–1.0)
pH: 5.5 (ref 5.0–8.0)

## 2014-11-24 LAB — LIPASE, BLOOD: LIPASE: 17 U/L — AB (ref 22–51)

## 2014-11-24 MED ORDER — ACETAMINOPHEN 325 MG PO TABS
650.0000 mg | ORAL_TABLET | Freq: Once | ORAL | Status: AC
  Administered 2014-11-24: 650 mg via ORAL
  Filled 2014-11-24: qty 2

## 2014-11-24 MED ORDER — SODIUM CHLORIDE 0.9 % IV BOLUS (SEPSIS)
1000.0000 mL | INTRAVENOUS | Status: AC
  Administered 2014-11-24: 1000 mL via INTRAVENOUS

## 2014-11-24 MED ORDER — DOXYCYCLINE HYCLATE 100 MG PO CAPS: 100.0000 mg | ORAL_CAPSULE | Freq: Two times a day (BID) | ORAL | Status: DC

## 2014-11-24 MED ORDER — DOXYCYCLINE HYCLATE 100 MG PO TABS
100.0000 mg | ORAL_TABLET | Freq: Once | ORAL | Status: AC
  Administered 2014-11-24: 100 mg via ORAL
  Filled 2014-11-24: qty 1

## 2014-11-24 NOTE — ED Provider Notes (Signed)
CSN: [id number]     Arrival date & time 11/24/14  1634 History   First MD Initiated Contact with Patient 11/24/14 1641     Chief Complaint  Patient presents with  . Emesis  . Fever     (Consider location/radiation/quality/duration/timing/severity/associated sxs/prior Treatment) Patient is a 64 y.o. male presenting with vomiting, fever, and cough. The history is provided by the patient.  Emesis Severity:  Mild Duration:  4 days Timing:  Intermittent Quality:  Stomach contents Able to tolerate:  Liquids Progression:  Improving Chronicity:  New Associated symptoms: abdominal pain (chronic and unchanged), chills, diarrhea and myalgias   Associated symptoms: no headaches   Diarrhea:    Quality:  Watery   Severity:  Mild   Duration:  4 days   Timing:  Intermittent   Progression:  Resolved Fever Associated symptoms: chills, cough, diarrhea, myalgias, nausea and vomiting   Associated symptoms: no chest pain, no dysuria, no headaches and no rhinorrhea   Cough Cough characteristics:  Non-productive Severity:  Mild Onset quality:  Sudden Duration:  4 days Timing:  Constant Progression:  Unchanged Chronicity:  New Smoker: yes   Context: upper respiratory infection   Relieved by:  Nothing Worsened by:  Nothing tried Ineffective treatments:  None tried Associated symptoms: chills, fever (subjective), myalgias and shortness of breath   Associated symptoms: no chest pain, no headaches and no rhinorrhea     Past Medical History  Diagnosis Date  . Hypertension   . Dyslipidemia   . Tobacco abuse   . Depression    Past Surgical History  Procedure Laterality Date  . Colon polyp resection     Family History  Problem Relation Age of Onset  . Heart disease Father   . Cancer Mother   . Stroke Brother   . Lung cancer Brother    History  Substance Use Topics  . Smoking status: Current Every Day Smoker -- 1.00 packs/day for 40 years    Types: Cigarettes  . Smokeless tobacco:  Never Used  . Alcohol Use: Yes     Comment: 3 alcohol beverages daily, fifth of gin a week    Review of Systems  Constitutional: Positive for fever (subjective) and chills.  HENT: Negative for drooling and rhinorrhea.   Eyes: Negative for pain.  Respiratory: Positive for cough and shortness of breath.   Cardiovascular: Negative for chest pain and leg swelling.  Gastrointestinal: Positive for nausea, vomiting, abdominal pain (chronic and unchanged) and diarrhea.  Genitourinary: Negative for dysuria and hematuria.  Musculoskeletal: Positive for myalgias. Negative for gait problem and neck pain.  Skin: Negative for color change.  Neurological: Negative for numbness and headaches.  Hematological: Negative for adenopathy.  Psychiatric/Behavioral: Negative for behavioral problems.  All other systems reviewed and are negative.     Allergies  Review of patient's allergies indicates no known allergies.  Home Medications   Prior to Admission medications   Medication Sig Start Date End Date Taking? Authorizing Provider  amLODipine (NORVASC) 10 MG tablet Take 10 mg by mouth daily.    Historical Provider, MD  atorvastatin (LIPITOR) 40 MG tablet Take 40 mg by mouth daily.    Historical Provider, MD  clotrimazole-betamethasone (LOTRISONE) cream Apply 1 application topically 2 (two) times daily.    Historical Provider, MD  cyclobenzaprine (FLEXERIL) 10 MG tablet Take 10 mg by mouth 2 (two) times daily as needed for muscle spasms.    Historical Provider, MD  levETIRAcetam (KEPPRA) 250 MG tablet Take 1 tablet (250 mg  total) by mouth every 12 (twelve) hours. 03/22/13   York Spaniel, MD  naproxen (NAPROSYN) 500 MG tablet Take 1 tablet (500 mg total) by mouth 2 (two) times daily. 11/06/12   Nelva Nay, MD  oxyCODONE-acetaminophen (PERCOCET) 7.5-325 MG per tablet Take 1 tablet by mouth every 4 (four) hours as needed for pain. 11/06/12   Nelva Nay, MD  valACYclovir (VALTREX) 1000 MG tablet Take  1,000 mg by mouth daily.    Historical Provider, MD   BP 124/65 mmHg  Pulse 112  Temp(Src) 99.7 F (37.6 C) (Oral)  Resp 18  Ht 5\' 7"  (1.702 m)  Wt 160 lb (72.576 kg)  BMI 25.05 kg/m2  SpO2 93% Physical Exam  Constitutional: He is oriented to person, place, and time. He appears well-developed and well-nourished.  HENT:  Head: Normocephalic and atraumatic.  Right Ear: External ear normal.  Left Ear: External ear normal.  Nose: Nose normal.  Mouth/Throat: Oropharynx is clear and moist. No oropharyngeal exudate.  Eyes: Conjunctivae and EOM are normal. Pupils are equal, round, and reactive to light.  Neck: Normal range of motion. Neck supple.  Cardiovascular: Regular rhythm, normal heart sounds and intact distal pulses.  Exam reveals no gallop and no friction rub.   No murmur heard. Tachycardia, 112  Pulmonary/Chest: Effort normal and breath sounds normal. No respiratory distress. He has no wheezes.  Abdominal: Soft. Bowel sounds are normal. He exhibits no distension. There is no tenderness. There is no rebound and no guarding.  Musculoskeletal: Normal range of motion. He exhibits no edema or tenderness.  Neurological: He is alert and oriented to person, place, and time.  Skin: Skin is warm and dry.  Psychiatric: He has a normal mood and affect. His behavior is normal.  Nursing note and vitals reviewed.   ED Course  Procedures (including critical care time) Labs Review Labs Reviewed  CBC WITH DIFFERENTIAL/PLATELET - Abnormal; Notable for the following:    WBC 12.4 (*)    Hemoglobin 12.4 (*)    HCT 36.6 (*)    Neutrophils Relative % 87 (*)    Neutro Abs 10.8 (*)    Lymphocytes Relative 8 (*)    All other components within normal limits  COMPREHENSIVE METABOLIC PANEL - Abnormal; Notable for the following:    Sodium 132 (*)    Chloride 95 (*)    Glucose, Bld 106 (*)    Creatinine, Ser 1.47 (*)    Albumin 3.3 (*)    AST 52 (*)    GFR calc non Af Amer 49 (*)    GFR calc Af  Amer 56 (*)    All other components within normal limits  LIPASE, BLOOD - Abnormal; Notable for the following:    Lipase 17 (*)    All other components within normal limits  URINALYSIS, ROUTINE W REFLEX MICROSCOPIC (NOT AT [facility]) - Abnormal; Notable for the following:    Color, Urine AMBER (*)    APPearance HAZY (*)    Hgb urine dipstick LARGE (*)    Bilirubin Urine SMALL (*)    Protein, ur 100 (*)    All other components within normal limits  URINE MICROSCOPIC-ADD ON - Abnormal; Notable for the following:    Squamous Epithelial / LPF FEW (*)    Bacteria, UA FEW (*)    Casts GRANULAR CAST (*)    All other components within normal limits  URINE CULTURE    Imaging Review Dg Chest 2 View  11/24/2014   CLINICAL DATA:  Dry cough.  EXAM: CHEST  2 VIEW  COMPARISON:  10/27/2010  FINDINGS: There is a right perihilar infiltrate with air bronchograms extending into the right upper lobe. There is slight extension into the right middle lobe as well. The appearance is atypical and I suspect the patient may have a right hilar mass.  There is a tiny right pleural effusion. Left lung is clear. Heart size and vascularity are normal. Degenerative osteophytes throughout the thoracic spine. No acute osseous abnormality. Slight pleural calcification at the right lung apex. Bilateral nipple shadows.  IMPRESSION: Atypical right perihilar infiltrate. This probably represents pneumonia but the possibly of an underlying tumor should be considered.   Electronically Signed   By: Francene Boyers M.D.   On: 11/24/2014 17:30     EKG Interpretation   Date/Time:  Saturday November 24 2014 17:39:34 EDT Ventricular Rate:  103 PR Interval:  145 QRS Duration: 72 QT Interval:  315 QTC Calculation: 412 R Axis:   74 Text Interpretation:  Sinus tachycardia Biatrial enlargement No  significant change since last tracing Confirmed by Velna Hedgecock  MD, Darlen Gledhill  ([id number]) on 11/24/2014 6:08:29 PM      MDM   Final diagnoses:  Cough   CAP (community acquired pneumonia)    4:55 PM 64 y.o. male w hx of HTN, smoker who presents with multiple symptoms which began 3 days ago after coming back from a appointment at the [place]. He states that he developed myalgias, chills, subjective fever, nonproductive cough, nausea, vomiting, and diarrhea. He denies blood in his emesis or diarrhea. He states that he feels a pain in his head when he coughs but does not otherwise have a headache. He is mildly tachycardic but vital signs are otherwise unremarkable here. He denies any chest pain. He states that he has some chronic stomach pain which is unchanged from baseline. Given his age, we'll get screening labs and imaging as well as IV fluids and Tylenol.  9:16 PM: Leukocytosis and some mild renal insuff. Pt got 1L IVF here w/ dec in HR. Eating/drinking w/out emesis. Is feeling much better. CXR w/ likely pna. Will tx and rec [facility] for [facility] in 2-3 weeks to ensure resolution of opacity. Upon d/c, HR has inc again w/ inc in temp. I offered more IVF, pt feeling better and prefers to go home. He was instructed to take tylenol on a schedule and push fluids.  I have discussed the diagnosis/risks/treatment options with the patient and believe the pt to be eligible for discharge home to follow-up with his pcp. We also discussed returning to the ED immediately if new or worsening sx occur. We discussed the sx which are most concerning (e.g., sob, intractable fever, inability to tolerate po) that necessitate immediate return. Medications administered to the patient during their visit and any new prescriptions provided to the patient are listed below.  Medications given during this visit Medications  sodium chloride 0.9 % bolus 1,000 mL (0 mLs Intravenous Stopped 11/24/14 1925)  acetaminophen (TYLENOL) tablet 650 mg (650 mg Oral Given 11/24/14 1701)  doxycycline (VIBRA-TABS) tablet 100 mg (100 mg Oral Given 11/24/14 2057)    New Prescriptions    DOXYCYCLINE (VIBRAMYCIN) 100 MG CAPSULE    Take 1 capsule (100 mg total) by mouth 2 (two) times daily. One po bid x 7 days     Purvis Sheffield, MD 11/25/14 1106

## 2014-11-24 NOTE — ED Notes (Signed)
Notified MD that pt's HR is still 114. MD at bedside discussing options/return protocols.

## 2014-11-24 NOTE — ED Notes (Signed)
Pt reports having a fever, bodyaches, cough, headache and n/v/d x 3 days.

## 2014-11-26 LAB — URINE CULTURE: CULTURE: NO GROWTH

## 2014-12-06 ENCOUNTER — Encounter: Payer: Self-pay | Admitting: Gastroenterology

## 2014-12-06 ENCOUNTER — Encounter: Payer: Self-pay | Admitting: Internal Medicine

## 2016-03-31 ENCOUNTER — Encounter: Payer: Self-pay | Admitting: Physician Assistant

## 2016-04-09 ENCOUNTER — Other Ambulatory Visit (INDEPENDENT_AMBULATORY_CARE_PROVIDER_SITE_OTHER): Payer: Medicare Other

## 2016-04-09 ENCOUNTER — Encounter: Payer: Self-pay | Admitting: Physician Assistant

## 2016-04-09 ENCOUNTER — Ambulatory Visit (INDEPENDENT_AMBULATORY_CARE_PROVIDER_SITE_OTHER): Payer: Medicare Other | Admitting: Physician Assistant

## 2016-04-09 ENCOUNTER — Encounter (INDEPENDENT_AMBULATORY_CARE_PROVIDER_SITE_OTHER): Payer: Self-pay

## 2016-04-09 VITALS — BP 130/78 | HR 64 | Ht 65.75 in | Wt 165.2 lb

## 2016-04-09 DIAGNOSIS — R1084 Generalized abdominal pain: Secondary | ICD-10-CM | POA: Diagnosis not present

## 2016-04-09 DIAGNOSIS — R194 Change in bowel habit: Secondary | ICD-10-CM

## 2016-04-09 DIAGNOSIS — K921 Melena: Secondary | ICD-10-CM

## 2016-04-09 LAB — CBC WITH DIFFERENTIAL/PLATELET
BASOS PCT: 1.6 % (ref 0.0–3.0)
Basophils Absolute: 0.2 10*3/uL — ABNORMAL HIGH (ref 0.0–0.1)
Eosinophils Absolute: 0.3 10*3/uL (ref 0.0–0.7)
Eosinophils Relative: 3.4 % (ref 0.0–5.0)
HEMATOCRIT: 48.1 % (ref 39.0–52.0)
HEMOGLOBIN: 16.1 g/dL (ref 13.0–17.0)
LYMPHS PCT: 37.2 % (ref 12.0–46.0)
Lymphs Abs: 3.5 10*3/uL (ref 0.7–4.0)
MCHC: 33.4 g/dL (ref 30.0–36.0)
MCV: 82.5 fl (ref 78.0–100.0)
MONOS PCT: 5.8 % (ref 3.0–12.0)
Monocytes Absolute: 0.5 10*3/uL (ref 0.1–1.0)
Neutro Abs: 4.9 10*3/uL (ref 1.4–7.7)
Neutrophils Relative %: 52 % (ref 43.0–77.0)
Platelets: 206 10*3/uL (ref 150.0–400.0)
RBC: 5.83 Mil/uL — ABNORMAL HIGH (ref 4.22–5.81)
RDW: 14.4 % (ref 11.5–15.5)
WBC: 9.4 10*3/uL (ref 4.0–10.5)

## 2016-04-09 LAB — COMPREHENSIVE METABOLIC PANEL
ALBUMIN: 4.6 g/dL (ref 3.5–5.2)
ALK PHOS: 78 U/L (ref 39–117)
ALT: 18 U/L (ref 0–53)
AST: 22 U/L (ref 0–37)
BUN: 11 mg/dL (ref 6–23)
CALCIUM: 9.7 mg/dL (ref 8.4–10.5)
CO2: 25 mEq/L (ref 19–32)
Chloride: 104 mEq/L (ref 96–112)
Creatinine, Ser: 0.99 mg/dL (ref 0.40–1.50)
GFR: 97.34 mL/min (ref >60.00)
Glucose, Bld: 99 mg/dL (ref 70–99)
POTASSIUM: 4.8 meq/L (ref 3.5–5.1)
Sodium: 138 mEq/L (ref 135–145)
TOTAL PROTEIN: 7.2 g/dL (ref 6.0–8.3)
Total Bilirubin: 0.4 mg/dL (ref 0.2–1.2)

## 2016-04-09 MED ORDER — OMEPRAZOLE 40 MG PO CPDR: DELAYED_RELEASE_CAPSULE | ORAL | 3 refills | Status: DC

## 2016-04-09 NOTE — Progress Notes (Signed)
Subjective:    Patient ID: Connor Stevens, male    DOB: [birth year], 65 y.o.   MRN: [id number]  HPI  Connor Stevens is a pleasant 65 year old African-American male known previously to Dr. Leone Payor. He was last seen in our office in 2012. He had undergone colonoscopy in 2008 with finding of multiple polyps in the left colon all 5 mm or less and a 6 mm polyp in the cecum. All of the polyps were adenomatous. He was to have 5 year interval follow-up. He also was noted to have internal hemorrhoids. EGD was done in 2009 with finding of a nodular gastritis and nodular erosive duodenitis. Biopsies showed chronic peptic duodenitis and gastritis no H. pylori. Patient comes in today with complaints of 6 month history of abdominal pain which has been repeatedly waking him from sleep early in the morning hours. This is a gassy ,dull uncomfortable feeling in his mid abdomen ,has tried Alka-Seltzer and Pepto-Bismol without any benefit. Denies any aspirin or NSAID use. He has developed some constipation as well, started taking Metamucil which is helping. He did see a small amounts of blood with his bowel movement yesterday but had not noted blood previously. Height is been somewhat decreased that but he has not had any significant weight loss He is not on any regular PPI therapy. He says he gets intermittent heartburn and frequently brings up phlegm from his throat though he has been a smoker, there is no true dysphagia.  Review of Systems Pertinent positive and negative review of systems were noted in the above HPI section.  All other review of systems was otherwise negative.  Outpatient Encounter Prescriptions as of 04/09/2016  Medication Sig  . aspirin-sod bicarb-citric acid (ALKA-SELTZER) 325 MG TBEF tablet Take 325 mg by mouth as needed.  Marland Kitchen ibuprofen (ADVIL,MOTRIN) 200 MG tablet Take 200 mg by mouth as needed.  . Multiple Vitamin (MULTIVITAMIN) tablet Take 1 tablet by mouth daily.  Marland Kitchen omeprazole (PRILOSEC) 40 MG capsule Take  1 capsule every morning.  . [DISCONTINUED] acetaminophen (TYLENOL) 500 MG tablet Take 1,000 mg by mouth every 6 (six) hours as needed (pain).  . [DISCONTINUED] Cholecalciferol (VITAMIN D) 2000 UNITS tablet Take 2,000 Units by mouth daily.  . [DISCONTINUED] doxycycline (VIBRAMYCIN) 100 MG capsule Take 1 capsule (100 mg total) by mouth 2 (two) times daily. One po bid x 7 days  . [DISCONTINUED] levETIRAcetam (KEPPRA) 250 MG tablet Take 1 tablet (250 mg total) by mouth every 12 (twelve) hours. (Patient not taking: Reported on 11/24/2014)  . [DISCONTINUED] naproxen (NAPROSYN) 500 MG tablet Take 1 tablet (500 mg total) by mouth 2 (two) times daily. (Patient not taking: Reported on 11/24/2014)  . [DISCONTINUED] omeprazole (PRILOSEC) 40 MG capsule Take 1 capsule every morning.  . [DISCONTINUED] oxyCODONE-acetaminophen (PERCOCET) 7.5-325 MG per tablet Take 1 tablet by mouth every 4 (four) hours as needed for pain. (Patient not taking: Reported on 11/24/2014)  . [DISCONTINUED] Phenyleph-Doxylamine-DM-APAP (ALKA SELTZER PLUS PO) Take 2 tablets by mouth daily as needed (congestion).   No facility-administered encounter medications on file as of 04/09/2016.    No Known Allergies Patient Active Problem List   Diagnosis Date Noted  . Disturbance of skin sensation 03/15/2013  . ANXIETY DISORDER 06/30/2010  . DEPRESSION 06/30/2010  . HEMORRHOIDS 06/30/2010  . DIVERTICULAR DISEASE 06/30/2010  . SLEEP APNEA 06/30/2010  . FLATULENCE ERUCTATION AND GAS PAIN 06/30/2010  . COLONIC POLYPS, ADENOMATOUS, HX OF 06/30/2010  . ALCOHOL USE 12/23/2007  . RECTAL BLEEDING 12/23/2007  . ABDOMINAL  PAIN-PERIUMBILICAL 12/23/2007   Social History   Social History  . Marital status: Single    Spouse name: N/A  . Number of children: 2  . Years of education: COLLEGE   Occupational History  . retired    Social History Main Topics  . Smoking status: Current Every Day Smoker    Packs/day: 1.00    Years: 40.00    Types:  Cigarettes  . Smokeless tobacco: Never Used  . Alcohol use No     Comment: stopped about a month ago  . Drug use: No  . Sexual activity: Not on file   Other Topics Concern  . Not on file   Social History Narrative  . No narrative on file    Mr. Donabedian family history includes Breast cancer in his mother, sister, and sister; Heart disease in his father; Kidney disease in his father; Lung cancer in his brother; Stroke in his brother.      Objective:    Vitals:   04/09/16 0851  BP: 130/78  Pulse: 64    Physical Exam  well-developed older African-American male in no acute distress, pleasant blood pressure 130/78 pulse 64, Height 5 foot 5, weight 165, BMI 26.8. HEENT; nontraumatic normocephalic EOMI PERRLA sclera anicteric, Cardiovascular ;regular rate and rhythm with S1-S2 no murmur or gallop, Pulmonary; somewhat decreased breath sounds, Abdomen ;soft, there is no palpable mass or hepatosplenomegaly no focal tenderness bowel sounds are present, Rectal; exam not done, Extremities; no clubbing cyanosis or edema skin warm and dry, Neuropsych ;mood and affect appropriate       Assessment & Plan:   #1 65 year old African-American male with a 6 month history of mid abdominal discomfort/pain usually waking him early in the morning hours, associated with change in bowel habits with constipation. Etiology is not clear, rule out gastropathy or peptic ulcer disease, rule out colonic lesion, rule out other intra-abdominal laboratory process #2 history of multiple adenomatous colon polyps-several years overdue for follow-up colonoscopy #3 history of gastritis and duodenitis 2009 #4 sleep apnea #5 anxiety # 6 history of EtOH use  Plan; start omeprazole 40 mg by mouth every morning Check CBC, CMET Continue Metamucil daily Will schedule for colonoscopy and EGD with Dr. Leone Payor. Procedures discussed in detail with the patient and he is agreeable to proceed. If endoscopic evaluation is  unrevealing will need CT of the abdomen and pelvis.   Nadina Fomby S Donnella Morford PA-C 04/09/2016   Cc: No ref. provider found

## 2016-04-09 NOTE — Patient Instructions (Addendum)
Please go to the basement level to have your labs drawn.  [facility] [address] Omeprazole 40 mg.  Continue Metamucil daily.  You have been scheduled for an endoscopy and colonoscopy. Please follow the written instructions given to you at your visit today. Please pick up your prep supplies at the pharmacy within the next 1-3 days. If you use inhalers (even only as needed), please bring them with you on the day of your procedure. Your physician has requested that you go to www.startemmi.com and enter the access code given to you at your visit today. This web site gives a general overview about your procedure. However, you should still follow specific instructions given to you by our office regarding your preparation for the procedure.

## 2016-04-10 NOTE — Progress Notes (Signed)
Agree with Ms. Esterwood's assessment and plan. Ronne Savoia E. Elira Colasanti, MD, FACG   

## 2016-05-08 ENCOUNTER — Encounter: Payer: Medicare Other | Admitting: Internal Medicine

## 2017-02-23 ENCOUNTER — Ambulatory Visit (INDEPENDENT_AMBULATORY_CARE_PROVIDER_SITE_OTHER): Payer: Medicare Other

## 2017-02-23 ENCOUNTER — Encounter: Payer: Self-pay | Admitting: Sports Medicine

## 2017-02-23 ENCOUNTER — Ambulatory Visit (INDEPENDENT_AMBULATORY_CARE_PROVIDER_SITE_OTHER): Payer: Medicare Other | Admitting: Sports Medicine

## 2017-02-23 VITALS — BP 144/92 | HR 80 | Ht 65.75 in | Wt 167.6 lb

## 2017-02-23 DIAGNOSIS — G8929 Other chronic pain: Secondary | ICD-10-CM

## 2017-02-23 DIAGNOSIS — M25511 Pain in right shoulder: Secondary | ICD-10-CM

## 2017-02-23 DIAGNOSIS — M4722 Other spondylosis with radiculopathy, cervical region: Secondary | ICD-10-CM

## 2017-02-23 DIAGNOSIS — M19011 Primary osteoarthritis, right shoulder: Secondary | ICD-10-CM | POA: Diagnosis not present

## 2017-02-23 DIAGNOSIS — M542 Cervicalgia: Secondary | ICD-10-CM

## 2017-02-23 MED ORDER — GABAPENTIN 300 MG PO CAPS: ORAL_CAPSULE | ORAL | 1 refills | Status: AC

## 2017-02-23 MED ORDER — METHYLPREDNISOLONE 4 MG PO TBPK: ORAL_TABLET | ORAL | 0 refills | Status: DC

## 2017-02-23 NOTE — Progress Notes (Signed)
OFFICE VISIT NOTE Connor Stevens. [facility] at [facility] [phone]  Connor Stevens - 66 y.o. male MRN [id number]  Date of birth: [birth year]  Visit Date: 02/23/2017  PCP: Patient, No Pcp Per   Referred by: No ref. provider found  Connor Stevens, CMA acting as scribe for Dr. Berline Chough.  SUBJECTIVE:   Chief Complaint  Patient presents with  . New Patient (Initial Visit)    RT shoulder pain   HPI: As below and per problem based documentation when appropriate.  Connor Stevens is a new patient presenting today for evaluation of RT shoulder pain. He also has pain in the LT shoulder and neck.  Pain has been present x 6 months but seems to be getting worse over time and causes him to wake from sleep at night.  He had xray c-spine a while back (with the [place]) and it did show changes with c-spine. Pain is mostly on the lateral aspect of the RT shoulder. He has not noticed any swelling around the shoulder. He is right hand dominant.   The pain is described as aching and throbbing. Pain is rated as 8/10.  Worsened with picking things up, he feels that his arm gives out and he has to told the RT arm up with the left arm, the RT arm is weaker. Pain is worse when raising hand overhead. Pain is also worsened by lifting weights (5 lbs).  Improves with rest, but pain is still present.  Therapies tried include : Ibuprofen 800 mg TID, no relief. He has tried using "hot pad" with minimal relief.   Other associated symptoms include: The pain radiates into the upper back, both shoulder and neck. This seems to be worse at night. He has tried wearing a soft collar to bed. At times he finds that he needs to sleep sitting in a recliner. He has noticed some numbness in the RT hand.      Review of Systems  Constitutional: Negative for chills and fever.  HENT: Positive for congestion.   Respiratory: Positive for shortness of breath. Negative for wheezing.   Cardiovascular:  Negative for chest pain and palpitations.  Musculoskeletal: Positive for back pain, joint pain and neck pain. Negative for falls.  Neurological: Positive for tingling. Negative for dizziness and headaches.  Endo/Heme/Allergies: Does not bruise/bleed easily.    Otherwise per HPI.   OBJECTIVE:  VS:  HT:5' 5.75" (167 cm)   WT:167 lb 9.6 oz (76 kg)  BMI:27.26    BP:(!) 144/92  HR:80bpm  TEMP: ( )  RESP:98 %  PHYSICAL EXAM: Constitutional: WDWN, Non-toxic appearing. Psychiatric: Alert & appropriately interactive. Not depressed or anxious appearing. Respiratory: No increased work of breathing. Trachea Midline Eyes: Pupils are equal. EOM intact without nystagmus. No scleral icterus  Bilateral upper extremities overall well aligned.  He has marked limitations in cervical sidebending and rotation.  No significant radicular symptoms with Spurling's compression test.  No pain with Lhermitte's compression test.  He has a generalized upper extremity C6-C7 dermatomal distribution dysesthesia.   Right shoulder has a small amount of pain with Hawkins and Neer's but this is mild.  Internal rotation external rotation strength is decreased bilaterally but symmetric.  No significant pain with axial load circumduction  ASSESSMENT & PLAN:   1. Chronic right shoulder pain   2. Neck pain   3. Osteoarthritis of spine with radiculopathy, cervical region    PLAN: We discussed multiple options today.  Given the  diffuse nature of his symptoms will begin him on steroid Dosepak and gabapentin and plan to follow-up with him in 6 weeks to ensure clinical resolution.  X-rays reviewed in person today with him.  ++++++++++++++++++++++++++++++++++++++++++++ Orders & Meds: Orders Placed This Encounter  Procedures  . DG Shoulder Right  . DG Cervical Spine 2 or 3 views    Meds ordered this encounter  Medications  . methylPREDNISolone (MEDROL DOSEPAK) 4 MG TBPK tablet    Sig: Take by mouth as directed. Take 6  tablets on the first day prescribed then as directed.    Dispense:  21 tablet    Refill:  0  . gabapentin (NEURONTIN) 300 MG capsule    Sig: Start with 1 tab po qhs X 1 week, then increase to 1 tab po bid X 1 week then 1 tab po tid prn    Dispense:  90 capsule    Refill:  1    ++++++++++++++++++++++++++++++++++++++++++++ Follow-up: Return in about 6 weeks (around 04/06/2017).   Pertinent documentation may be included in additional procedure notes, imaging studies, problem based documentation and patient instructions. Please see these sections of the encounter for additional information regarding this visit. CMA/ATC served as Neurosurgeon during this visit. History, Physical, and Plan performed by medical provider. Documentation and orders reviewed and attested to.      Andrena Mews, DO    [facility]

## 2017-04-06 ENCOUNTER — Ambulatory Visit: Payer: Medicare Other | Admitting: Sports Medicine

## 2017-09-18 ENCOUNTER — Ambulatory Visit (HOSPITAL_COMMUNITY)
Admission: EM | Admit: 2017-09-18 | Discharge: 2017-09-18 | Disposition: A | Discharge status: Home / Self Care | Payer: Medicare Other | Attending: Family Medicine | Admitting: Family Medicine

## 2017-09-18 ENCOUNTER — Other Ambulatory Visit: Payer: Self-pay

## 2017-09-18 ENCOUNTER — Encounter (HOSPITAL_COMMUNITY): Payer: Self-pay | Admitting: Emergency Medicine

## 2017-09-18 DIAGNOSIS — M25511 Pain in right shoulder: Secondary | ICD-10-CM

## 2017-09-18 MED ORDER — MELOXICAM 7.5 MG PO TABS: 7.5000 mg | ORAL_TABLET | Freq: Every day | ORAL | 0 refills | Status: DC

## 2017-09-18 NOTE — ED Provider Notes (Signed)
[facility]    CSN: [id number] Arrival date & time: 09/18/17  1605     History   Chief Complaint Chief Complaint  Patient presents with  . Extremity Pain    HPI Connor Stevens is a 67 y.o. male.   Connor Stevens presents with complaints of right shoulder and arm pain which has been ongoing for >6 months. No specific known injury. Pain is worse at night and with raising of his arm. He is right handed. Has seen sports medicine MD as well as has been working with the [place] for this, states had an MRI last year. Was prescribed gabapentin in the past which he is not taking. Uses a Tens Unit which briefly helps. Has had a cortisone injection to posterior shoulder which helped for approximately 1 month. Applies icy hot and muscle rub which does not help. States he does recommended exercises as well. Feels his hand and arm are week. Mild right sided neck pain. Hx of ibs, depression, htn.   ROS per HPI.      Past Medical History:  Diagnosis Date  . Depression   . Dyslipidemia   . Hypertension   . IBS (irritable bowel syndrome)   . Kidney stones   . Tobacco abuse     Patient Active Problem List   Diagnosis Date Noted  . Disturbance of skin sensation 03/15/2013  . ANXIETY DISORDER 06/30/2010  . DEPRESSION 06/30/2010  . HEMORRHOIDS 06/30/2010  . DIVERTICULAR DISEASE 06/30/2010  . SLEEP APNEA 06/30/2010  . FLATULENCE ERUCTATION AND GAS PAIN 06/30/2010  . COLONIC POLYPS, ADENOMATOUS, HX OF 06/30/2010  . ALCOHOL USE 12/23/2007  . RECTAL BLEEDING 12/23/2007  . ABDOMINAL PAIN-PERIUMBILICAL 12/23/2007    Past Surgical History:  Procedure Laterality Date  . colon polyp resection         Home Medications    Prior to Admission medications   Medication Sig Start Date End Date Taking? Authorizing Provider  acetaminophen (TYLENOL) 325 MG tablet Take 650 mg by mouth every 6 (six) hours as needed.   Yes [provider]  cholecalciferol (VITAMIN D) 1000 units tablet Take  1,000 Units by mouth daily.   Yes [provider]  Omega-3 Fatty Acids (FISH OIL) 1000 MG CAPS Take by mouth.   Yes [provider]  gabapentin (NEURONTIN) 300 MG capsule Start with 1 tab po qhs X 1 week, then increase to 1 tab po bid X 1 week then 1 tab po tid prn 02/23/17   Andrena Mews, DO  ibuprofen (ADVIL,MOTRIN) 800 MG tablet Take 800 mg by mouth every 6 (six) hours as needed.    [provider]  meloxicam (MOBIC) 7.5 MG tablet Take 1 tablet (7.5 mg total) by mouth daily. 09/18/17   Linus Mako B, NP  methylPREDNISolone (MEDROL DOSEPAK) 4 MG TBPK tablet Take by mouth as directed. Take 6 tablets on the first day prescribed then as directed. 02/23/17   Andrena Mews, DO    Family History Family History  Problem Relation Age of Onset  . Heart disease Father   . Kidney disease Father   . Breast cancer Mother   . Stroke Brother   . Lung cancer Brother   . Breast cancer Sister   . Breast cancer Sister   . Colon cancer Neg Hx   . Stomach cancer Neg Hx   . Esophageal cancer Neg Hx   . Rectal cancer Neg Hx   . Liver cancer Neg Hx     Social  History Social History   Tobacco Use  . Smoking status: Current Every Day Smoker    Packs/day: 1.00    Years: 40.00    Pack years: 40.00    Types: Cigarettes  . Smokeless tobacco: Never Used  Substance Use Topics  . Alcohol use: No    Comment: stopped about a month ago  . Drug use: No     Allergies   Patient has no known allergies.   Review of Systems Review of Systems   Physical Exam Triage Vital Signs ED Triage Vitals  Enc Vitals Group     BP 09/18/17 1703 (!) 170/93     Pulse Rate 09/18/17 1703 76     Resp 09/18/17 1703 16     Temp 09/18/17 1703 97.7 F (36.5 C)     Temp Source 09/18/17 1703 Oral     SpO2 09/18/17 1703 95 %     Weight --      Height --      Head Circumference --      Peak Flow --      Pain Score 09/18/17 1715 10     Pain Loc --      Pain Edu? --      Excl. in GC?  --    No data found.  Updated Vital Signs BP (!) 170/93 (BP Location: Left Arm)   Pulse 76   Temp 97.7 F (36.5 C) (Oral)   Resp 16   SpO2 95%   Visual Acuity Right Eye Distance:   Left Eye Distance:   Bilateral Distance:    Right Eye Near:   Left Eye Near:    Bilateral Near:     Physical Exam  Constitutional: He is oriented to person, place, and time. He appears well-developed and well-nourished.  Cardiovascular: Normal rate and regular rhythm.  Pulmonary/Chest: Effort normal and breath sounds normal.  Musculoskeletal:       Right shoulder: He exhibits decreased range of motion, tenderness and pain. He exhibits no bony tenderness, no swelling, no effusion, no crepitus, no deformity, no laceration, no spasm, normal pulse and normal strength.       Arms: Right arm strength equal in all directions with push and pulls; pain with raising of arm above the head and pain on palpation to medial shoulder; without bony tenderness; pain with crossing arm across chest; indicates that pain radiates down into bicep; neck without tenderness and with full ROM noted ; sensation intact and equal, strong equal radial pulses   Neurological: He is alert and oriented to person, place, and time.  Skin: Skin is warm and dry.     UC Treatments / Results  Labs (all labs ordered are listed, but only abnormal results are displayed) Labs Reviewed - No data to display  EKG None Radiology No results found.  Procedures Procedures (including critical care time)  Medications Ordered in UC Medications - No data to display   Initial Impression / Assessment and Plan / UC Course  I have reviewed the triage vital signs and the nursing notes.  Pertinent labs & imaging results that were available during my care of the patient were reviewed by me and considered in my medical decision making (see chart for details).     Patient has had evaluation of this ongoing for the past 6 weeks at [facility] as well  as with the [place]. Imaging has been completed. No new injury. Without specific findings on exam. Daily meloxicam recommended. [facility]or establish with orthopedics for further evaluation and treatment. Patient verbalized understanding and agreeable to plan.    Final Clinical Impressions(s) / UC Diagnoses   Final diagnoses:  Pain in joint of right shoulder    ED Discharge Orders        Ordered    meloxicam (MOBIC) 7.5 MG tablet  Daily     09/18/17 1743       Controlled Substance Prescriptions [place] Controlled Substance Registry consulted? Not Applicable   Georgetta HaberBurky, Deaundre Allston B, NP 09/18/17 1751

## 2017-09-18 NOTE — Discharge Instructions (Signed)
Continue with previously recommended use of Tens Unit and exercises.  [facility] or [facility] with [facility] for further evaluation and treatment of your pain. Daily meloxicam, take with food. Do not take additional ibuprofen or aleve but may continue with tylenol.

## 2017-09-18 NOTE — ED Triage Notes (Signed)
C/o right arm pain, no known injury, states has been doing PT for decreased ROM

## 2017-10-27 ENCOUNTER — Ambulatory Visit (INDEPENDENT_AMBULATORY_CARE_PROVIDER_SITE_OTHER): Payer: Medicare Other | Admitting: Orthopaedic Surgery

## 2017-10-27 ENCOUNTER — Encounter (INDEPENDENT_AMBULATORY_CARE_PROVIDER_SITE_OTHER): Payer: Self-pay | Admitting: Orthopaedic Surgery

## 2017-10-27 ENCOUNTER — Telehealth (INDEPENDENT_AMBULATORY_CARE_PROVIDER_SITE_OTHER): Payer: Self-pay

## 2017-10-27 DIAGNOSIS — M25511 Pain in right shoulder: Secondary | ICD-10-CM | POA: Diagnosis not present

## 2017-10-27 DIAGNOSIS — G8929 Other chronic pain: Secondary | ICD-10-CM | POA: Diagnosis not present

## 2017-10-27 MED ORDER — TRAMADOL HCL 50 MG PO TABS: 50.0000 mg | ORAL_TABLET | Freq: Three times a day (TID) | ORAL | 2 refills | Status: DC | PRN

## 2017-10-27 NOTE — Progress Notes (Signed)
Office Visit Note   [name]           Date of Birth: 03-Mar-1951           MRN: [id number] Visit Date: 10/27/2017              Requested by: No referring provider defined for this encounter. PCP: Patient, No Pcp Per   Assessment & Plan: Visit Diagnoses:  1. Chronic right shoulder pain     Plan: Impression is right shoulder pain suspect rotator cuff tear with weakness and pain and shoulder dysfunction.  Recommend MRI to assess for rotator cuff tear.  Follow-up after the MRI.  Prescription for tramadol.  Follow-Up Instructions: Return in about 10 days (around 11/06/2017).   Orders:  Orders Placed This Encounter  Procedures  . MR SHOULDER RIGHT WO CONTRAST   Meds ordered this encounter  Medications  . traMADol (ULTRAM) 50 MG tablet    Sig: Take 1-2 tablets (50-100 mg total) by mouth 3 (three) times daily as needed.    Dispense:  30 tablet    Refill:  2      Procedures: No procedures performed   Clinical Data: No additional findings.   Subjective: Chief Complaint  Patient presents with  . Right Shoulder - Pain    Patient is a 67 year old gentleman who comes in with right shoulder pain for 7 to 8 months.  Denies any injuries.  He has pain at night and weakness in the arm especially with raising arm above shoulder level.  He takes ibuprofen, gabapentin, meloxicam occasionally.  Denies any numbness and tingling or radicular symptoms.   Review of Systems  Constitutional: Negative.   All other systems reviewed and are negative.    Objective: Vital Signs: There were no vitals taken for this visit.  Physical Exam  Constitutional: He is oriented to person, place, and time. He appears well-developed and well-nourished.  HENT:  Head: Normocephalic and atraumatic.  Eyes: Pupils are equal, round, and reactive to light.  Neck: Neck supple.  Pulmonary/Chest: Effort normal.  Abdominal: Soft.  Musculoskeletal: Normal range of motion.  Neurological: He is  alert and oriented to person, place, and time.  Skin: Skin is warm.  Psychiatric: He has a normal mood and affect. His behavior is normal. Judgment and thought content normal.  Nursing note and vitals reviewed.   Ortho Exam Right shoulder exam shows positive empty can and positive impingement.  He has normal passive range of motion. Specialty Comments:  No specialty comments available.  Imaging: No results found.   PMFS History: Patient Active Problem List   Diagnosis Date Noted  . Disturbance of skin sensation 03/15/2013  . ANXIETY DISORDER 06/30/2010  . DEPRESSION 06/30/2010  . HEMORRHOIDS 06/30/2010  . DIVERTICULAR DISEASE 06/30/2010  . SLEEP APNEA 06/30/2010  . FLATULENCE ERUCTATION AND GAS PAIN 06/30/2010  . COLONIC POLYPS, ADENOMATOUS, HX OF 06/30/2010  . ALCOHOL USE 12/23/2007  . RECTAL BLEEDING 12/23/2007  . ABDOMINAL PAIN-PERIUMBILICAL 12/23/2007   Past Medical History:  Diagnosis Date  . Depression   . Dyslipidemia   . Hypertension   . IBS (irritable bowel syndrome)   . Kidney stones   . Tobacco abuse     Family History  Problem Relation Age of Onset  . Heart disease Father   . Kidney disease Father   . Breast cancer Mother   . Stroke Brother   . Lung cancer Brother   . Breast cancer Sister   . Breast cancer Sister   .  Colon cancer Neg Hx   . Stomach cancer Neg Hx   . Esophageal cancer Neg Hx   . Rectal cancer Neg Hx   . Liver cancer Neg Hx     Past Surgical History:  Procedure Laterality Date  . colon polyp resection     Social History   Occupational History  . Occupation: retired  Tobacco Use  . Smoking status: Current Every Day Smoker    Packs/day: 1.00    Years: 40.00    Pack years: 40.00    Types: Cigarettes  . Smokeless tobacco: Never Used  Substance and Sexual Activity  . Alcohol use: No    Comment: stopped about a month ago  . Drug use: No  . Sexual activity: Not on file

## 2017-10-27 NOTE — Telephone Encounter (Signed)
Faxed Rx for Tramadol.

## 2017-11-05 ENCOUNTER — Ambulatory Visit (INDEPENDENT_AMBULATORY_CARE_PROVIDER_SITE_OTHER): Payer: Medicare Other | Admitting: Orthopaedic Surgery

## 2017-11-08 ENCOUNTER — Telehealth (INDEPENDENT_AMBULATORY_CARE_PROVIDER_SITE_OTHER): Payer: Self-pay | Admitting: *Deleted

## 2017-11-08 ENCOUNTER — Other Ambulatory Visit (INDEPENDENT_AMBULATORY_CARE_PROVIDER_SITE_OTHER): Payer: Self-pay

## 2017-11-08 MED ORDER — DIAZEPAM 5 MG PO TABS: ORAL_TABLET | ORAL | 0 refills | Status: DC

## 2017-11-08 NOTE — Telephone Encounter (Signed)
Pt is scheduled to have MRI on Thurs June 13 and he is requesting medication to help relax him. Please advise

## 2017-11-08 NOTE — Telephone Encounter (Signed)
What meds do you like to send for this?

## 2017-11-08 NOTE — Telephone Encounter (Signed)
Valium 5 mg

## 2017-11-08 NOTE — Telephone Encounter (Signed)
Called into pharmacy

## 2017-11-09 DIAGNOSIS — M7541 Impingement syndrome of right shoulder: Secondary | ICD-10-CM | POA: Diagnosis not present

## 2017-11-09 DIAGNOSIS — M25511 Pain in right shoulder: Secondary | ICD-10-CM | POA: Diagnosis not present

## 2017-11-11 ENCOUNTER — Ambulatory Visit
Admission: RE | Admit: 2017-11-11 | Discharge: 2017-11-11 | Disposition: A | Discharge status: Home / Self Care | Payer: Medicare Other | Source: Ambulatory Visit | Attending: Orthopaedic Surgery | Admitting: Orthopaedic Surgery

## 2017-11-11 DIAGNOSIS — G8929 Other chronic pain: Secondary | ICD-10-CM

## 2017-11-11 DIAGNOSIS — M25511 Pain in right shoulder: Secondary | ICD-10-CM | POA: Diagnosis not present

## 2017-11-16 ENCOUNTER — Ambulatory Visit (INDEPENDENT_AMBULATORY_CARE_PROVIDER_SITE_OTHER): Payer: Medicare Other | Admitting: Orthopaedic Surgery

## 2017-11-17 ENCOUNTER — Ambulatory Visit (INDEPENDENT_AMBULATORY_CARE_PROVIDER_SITE_OTHER): Payer: Medicare Other | Admitting: Orthopaedic Surgery

## 2017-11-17 ENCOUNTER — Encounter (INDEPENDENT_AMBULATORY_CARE_PROVIDER_SITE_OTHER): Payer: Self-pay

## 2019-02-01 DIAGNOSIS — K635 Polyp of colon: Secondary | ICD-10-CM | POA: Diagnosis not present

## 2019-03-14 DIAGNOSIS — L089 Local infection of the skin and subcutaneous tissue, unspecified: Secondary | ICD-10-CM | POA: Diagnosis not present

## 2019-03-14 DIAGNOSIS — B356 Tinea cruris: Secondary | ICD-10-CM | POA: Diagnosis not present

## 2019-03-14 DIAGNOSIS — B958 Unspecified staphylococcus as the cause of diseases classified elsewhere: Secondary | ICD-10-CM | POA: Diagnosis not present

## 2021-08-07 ENCOUNTER — Other Ambulatory Visit (HOSPITAL_COMMUNITY): Payer: Self-pay | Admitting: Radiation Oncology

## 2021-08-07 ENCOUNTER — Other Ambulatory Visit: Payer: Self-pay | Admitting: Radiation Oncology

## 2021-08-07 DIAGNOSIS — R972 Elevated prostate specific antigen [PSA]: Secondary | ICD-10-CM

## 2021-08-18 ENCOUNTER — Other Ambulatory Visit: Payer: Self-pay

## 2021-08-18 ENCOUNTER — Ambulatory Visit (HOSPITAL_COMMUNITY)
Admission: RE | Admit: 2021-08-18 | Discharge: 2021-08-18 | Disposition: A | Discharge status: Home / Self Care | Payer: No Typology Code available for payment source | Source: Ambulatory Visit | Attending: Radiation Oncology | Admitting: Radiation Oncology

## 2021-08-18 DIAGNOSIS — R972 Elevated prostate specific antigen [PSA]: Secondary | ICD-10-CM

## 2021-08-18 MED ORDER — GADOBUTROL 1 MMOL/ML IV SOLN
7.0000 mL | Freq: Once | INTRAVENOUS | Status: AC | PRN
  Administered 2021-08-18: 7 mL via INTRAVENOUS

## 2021-10-28 ENCOUNTER — Encounter (HOSPITAL_COMMUNITY): Payer: Self-pay

## 2021-10-28 ENCOUNTER — Ambulatory Visit (HOSPITAL_COMMUNITY)
Admission: EM | Admit: 2021-10-28 | Discharge: 2021-10-28 | Disposition: A | Discharge status: Home / Self Care | Payer: No Typology Code available for payment source | Attending: Emergency Medicine | Admitting: Emergency Medicine

## 2021-10-28 DIAGNOSIS — R350 Frequency of micturition: Secondary | ICD-10-CM | POA: Diagnosis not present

## 2021-10-28 DIAGNOSIS — R102 Pelvic and perineal pain: Secondary | ICD-10-CM | POA: Diagnosis not present

## 2021-10-28 LAB — POCT URINALYSIS DIPSTICK, ED / UC
Bilirubin Urine: NEGATIVE
Glucose, UA: NEGATIVE mg/dL
Hgb urine dipstick: NEGATIVE
Ketones, ur: NEGATIVE mg/dL
Leukocytes,Ua: NEGATIVE
Nitrite: NEGATIVE
Protein, ur: NEGATIVE mg/dL
Specific Gravity, Urine: 1.01 (ref 1.005–1.030)
Urobilinogen, UA: 0.2 mg/dL (ref 0.0–1.0)
pH: 7 (ref 5.0–8.0)

## 2021-10-28 MED ORDER — OXYCODONE HCL 5 MG PO TABS: 5.0000 mg | ORAL_TABLET | Freq: Four times a day (QID) | ORAL | 0 refills | Status: AC | PRN

## 2021-10-28 NOTE — Discharge Instructions (Signed)
The cause of your symptoms is unknown at this time, while they may easily be related to your prostate I have concerns about the amount of pain you are experiencing and would like you to get follow-up and imaging as soon as possible  Your urinalysis today was negative for infection  On exam you had tenderness over your bladder and over your left kidney  Your vital signs are stable  You may use oxycodone every 6 hours as needed for severe pain, please be mindful this medication may make you drowsy  Please go to the [facility] walk-in clinic tomorrow to be further evaluated  If your pain becomes severe overnight and the medication is not effective please go to the nearest emergency department for immediate evaluation

## 2021-10-28 NOTE — ED Triage Notes (Signed)
Pt c/o lower abdominal pain/pressure with urinary frequency x1wk that has worsen. States now unable to sleep at night. Took OTC with no relief.

## 2021-10-28 NOTE — ED Provider Notes (Signed)
[facility]    CSN: [id number] Arrival date & time: 10/28/21  1359      History   Chief Complaint Chief Complaint  Patient presents with   Abdominal Pain   Urinary Tract Infection    HPI Connor Stevens is a 71 y.o. male.   Patient presents with lower abdominal pain, right lower back pain, fever, chills and urinary frequency for 7 days.  Symptoms worsened overnight interfering with sleep.  Has attempted use of ibuprofen, Alka-Seltzer and a laxative which have all been ineffective.  Denies dysuria, hematuria, penile discharge, penile or testicle swelling, nausea, vomiting, diarrhea, constipation, bloating, increased gas production, heartburn or indigestion.  History of nephrolithiasis. Endorses that on a recent blood work his PSA level was noted to be elevated and he has an upcoming appointment with urology next week.   Past Medical History:  Diagnosis Date   Depression    Dyslipidemia    Hypertension    IBS (irritable bowel syndrome)    Kidney stones    Tobacco abuse     Patient Active Problem List   Diagnosis Date Noted   Disturbance of skin sensation 03/15/2013   ANXIETY DISORDER 06/30/2010   DEPRESSION 06/30/2010   HEMORRHOIDS 06/30/2010   DIVERTICULAR DISEASE 06/30/2010   SLEEP APNEA 06/30/2010   FLATULENCE ERUCTATION AND GAS PAIN 06/30/2010   COLONIC POLYPS, ADENOMATOUS, HX OF 06/30/2010   ALCOHOL USE 12/23/2007   RECTAL BLEEDING 12/23/2007   ABDOMINAL PAIN-PERIUMBILICAL 12/23/2007    Past Surgical History:  Procedure Laterality Date   colon polyp resection         Home Medications    Prior to Admission medications   Medication Sig Start Date End Date Taking? Authorizing Provider  acetaminophen (TYLENOL) 325 MG tablet Take 650 mg by mouth every 6 (six) hours as needed.    [provider]  cholecalciferol (VITAMIN D) 1000 units tablet Take 1,000 Units by mouth daily.    [provider]  cyclobenzaprine (FLEXERIL) 10 MG tablet  Take by mouth. 10/28/12   [provider]  diazepam (VALIUM) 5 MG tablet Take one by mouth one hour prior to MRI, repeat as needed 11/08/17   Tarry Kos, MD  gabapentin (NEURONTIN) 300 MG capsule Start with 1 tab po qhs X 1 week, then increase to 1 tab po bid X 1 week then 1 tab po tid prn 02/23/17   Andrena Mews, DO  ibuprofen (ADVIL,MOTRIN) 800 MG tablet Take 800 mg by mouth every 6 (six) hours as needed.    [provider]  Omega-3 Fatty Acids (FISH OIL) 1000 MG CAPS Take by mouth.    [provider]    Family History Family History  Problem Relation Age of Onset   Heart disease Father    Kidney disease Father    Breast cancer Mother    Stroke Brother    Lung cancer Brother    Breast cancer Sister    Breast cancer Sister    Colon cancer Neg Hx    Stomach cancer Neg Hx    Esophageal cancer Neg Hx    Rectal cancer Neg Hx    Liver cancer Neg Hx     Social History Social History   Tobacco Use   Smoking status: Every Day    Packs/day: 1.00    Years: 40.00    Pack years: 40.00    Types: Cigarettes   Smokeless tobacco: Never  Substance Use Topics   Alcohol use: No  Comment: stopped about a month ago   Drug use: No     Allergies   Patient has no known allergies.   Review of Systems Review of Systems  Constitutional:  Positive for chills and fever. Negative for activity change, appetite change, diaphoresis, fatigue and unexpected weight change.  Respiratory: Negative.    Cardiovascular: Negative.   Gastrointestinal:  Positive for abdominal pain. Negative for abdominal distention, anal bleeding, blood in stool, constipation, diarrhea, nausea, rectal pain and vomiting.  Genitourinary:  Positive for flank pain and frequency. Negative for decreased urine volume, difficulty urinating, dysuria, enuresis, genital sores, hematuria, penile discharge, penile pain, penile swelling, scrotal swelling, testicular pain and urgency.  Skin: Negative.      Physical Exam Triage Vital Signs ED Triage Vitals [10/28/21 1516]  Enc Vitals Group     BP 139/80     Pulse Rate 75     Resp 18     Temp 98.5 F (36.9 C)     Temp Source Oral     SpO2 100 %     Weight      Height      Head Circumference      Peak Flow      Pain Score 10     Pain Loc      Pain Edu?      Excl. in GC?    No data found.  Updated Vital Signs BP 139/80 (BP Location: Right Arm)   Pulse 75   Temp 98.5 F (36.9 C) (Oral)   Resp 18   SpO2 100%   Visual Acuity Right Eye Distance:   Left Eye Distance:   Bilateral Distance:    Right Eye Near:   Left Eye Near:    Bilateral Near:     Physical Exam Constitutional:      Appearance: Normal appearance. He is well-developed.  Eyes:     Extraocular Movements: Extraocular movements intact.  Pulmonary:     Effort: Pulmonary effort is normal.  Abdominal:     General: Abdomen is flat. Bowel sounds are increased.     Palpations: Abdomen is soft.     Tenderness: There is abdominal tenderness in the suprapubic area. There is left CVA tenderness.  Skin:    General: Skin is warm and dry.  Neurological:     Mental Status: He is alert and oriented to person, place, and time. Mental status is at baseline.  Psychiatric:        Mood and Affect: Mood normal.        Behavior: Behavior normal.     UC Treatments / Results  Labs (all labs ordered are listed, but only abnormal results are displayed) Labs Reviewed  POCT URINALYSIS DIPSTICK, ED / UC    EKG   Radiology No results found.  Procedures Procedures (including critical care time)  Medications Ordered in UC Medications - No data to display  Initial Impression / Assessment and Plan / UC Course  I have reviewed the triage vital signs and the nursing notes.  Pertinent labs & imaging results that were available during my care of the patient were reviewed by me and considered in my medical decision making (see chart for details).  Urinary  frequency Suprapubic pain  Vital signs are stable and patient is in no signs of distress, urinalysis negative, discussed findings, symptomology could be related to prostate however due to worsening pain over the last weeks, imaging is warranted, discussed with patient, he would like to avoid going  to the emergency department, requesting pain medicine for 1 night and [facility], oxycodone immediate release prescribed, PDMP reviewed, low risk, given strict precautions that if symptoms worsen throughout the night that he is to go to the nearest emergency department for further evaluation, verbalized understanding Final Clinical Impressions(s) / UC Diagnoses   Final diagnoses:  None   Discharge Instructions   None    ED Prescriptions   None    PDMP not reviewed this encounter.   Valinda Hoar, [place] 10/29/21 [phone]

## 2022-02-25 ENCOUNTER — Ambulatory Visit: Payer: No Typology Code available for payment source | Attending: Internal Medicine | Admitting: Physical Therapy

## 2022-02-25 ENCOUNTER — Encounter: Payer: Self-pay | Admitting: Physical Therapy

## 2022-02-25 ENCOUNTER — Other Ambulatory Visit: Payer: Self-pay

## 2022-02-25 DIAGNOSIS — M5459 Other low back pain: Secondary | ICD-10-CM | POA: Insufficient documentation

## 2022-02-25 DIAGNOSIS — M6281 Muscle weakness (generalized): Secondary | ICD-10-CM | POA: Insufficient documentation

## 2022-02-25 NOTE — Therapy (Signed)
OUTPATIENT PHYSICAL THERAPY THORACOLUMBAR EVALUATION   Patient Name: Connor Stevens MRN: [id number] DOB:10/31/1950, 71 y.o., male Today's Date: 02/25/2022   PT End of Session - 02/25/22 1524     Visit Number 1    Number of Visits 15    Date for PT Re-Evaluation 04/22/22    [facility]ization Type [facility] 15 visits 9/1-12/30    PT Start Time 1525    PT Stop Time 1610    PT Time Calculation (min) 45 min    Activity Tolerance Patient tolerated treatment well             Past Medical History:  Diagnosis Date   Depression    Dyslipidemia    Hypertension    IBS (irritable bowel syndrome)    Kidney stones    Tobacco abuse    Past Surgical History:  Procedure Laterality Date   colon polyp resection     Patient Active Problem List   Diagnosis Date Noted   Disturbance of skin sensation 03/15/2013   ANXIETY DISORDER 06/30/2010   DEPRESSION 06/30/2010   HEMORRHOIDS 06/30/2010   DIVERTICULAR DISEASE 06/30/2010   SLEEP APNEA 06/30/2010   FLATULENCE ERUCTATION AND GAS PAIN 06/30/2010   COLONIC POLYPS, ADENOMATOUS, HX OF 06/30/2010   ALCOHOL USE 12/23/2007   RECTAL BLEEDING 12/23/2007   ABDOMINAL PAIN-PERIUMBILICAL 12/23/2007    [facility]  REFERRING PROVIDER: Alvino Chapel MD  REFERRING DIAG: M54.50 low back pain  Rationale for Evaluation and Treatment Rehabilitation  THERAPY DIAG:  Back pain; weakness  ONSET DATE: 06/01/2021  SUBJECTIVE:                                                                                                                                                                                           SUBJECTIVE STATEMENT: Back pain started when I was in the [employer].  Worsened recently right > left.  Right numbness lower leg intermittently.   PERTINENT HISTORY:  No spinal surgeries; right rotator cuff surgery 3 years ago Undergoing testing for abdominal pain (colonoscopy and endoscopy OK) Will be having biopsy for prostate Has TENS unit at  home CVA 5-6 years ago with reports of some memory deficits  Has some weights at home  PAIN:  Are you having pain? Yes NPRS scale: 7/10 Pain location: right > left low back; right lower leg numbness  Aggravating factors: lying down, sitting; more 1/4 mile walking Relieving factors: sit in recliner; massage belt, heating pad; back brace when mowing grass, trimming;  short distance OK walking   PRECAUTIONS: None  WEIGHT BEARING RESTRICTIONS No  FALLS:  Has patient fallen in last 6 months? No  OCCUPATION: retired  PLOF: Independent  PATIENT GOALS relieve pain and anxiety from this   OBJECTIVE:   DIAGNOSTIC FINDINGS:  Degenerative changes in lumbar spine;  mild OA bil hips  PATIENT SURVEYS:  FOTO 37% "extreme difficulty performing usual household activities"   COGNITION:  Overall cognitive status: Within functional limits for tasks assessed       MUSCLE LENGTH: Hamstrings: Right 30 deg; Left 30 deg Thomas test: Right 5 deg; Left 5 deg  POSTURE: decreased lumbar lordosis  PALPATION: Decreased fascial mobility lumbar region  LUMBAR ROM:   Active  A/PROM  eval  Flexion Fingertips to knees  Extension 5  Right lateral flexion 15  Left lateral flexion 15  Right rotation   Left rotation    (Blank rows = not tested)  LOWER EXTREMITY ROM:   Very stiff with bil hip mobility particularly hip internal and external rotation;  bil hip flexion 85 degrees bil    LOWER EXTREMITY MMT:  Able to heel and toe raise;  SLS 8 sec bil; able to rise sit to stand without UE assist but very slowly  MMT Right eval Left eval  Hip flexion    Hip extension 4- 4-  Hip abduction 4- 4-  Hip adduction    Hip internal rotation    Hip external rotation    Knee flexion    Knee extension 4 4  Ankle dorsiflexion 4 4  Ankle plantarflexion 4 4  Ankle inversion    Ankle eversion     (Blank rows = not tested) TRUNK STRENGTH:  Decreased activation of transverse abdominus muscles;  abdominals 4-/5; decreased activation of lumbar multifidi; trunk extensors 4-/5  LUMBAR SPECIAL TESTS:  + right SLR  FUNCTIONAL TESTS:  Timed up and go (TUG): 28 sec no UE use with sit to stand  GAIT:  Comments: decreased gait speed    TODAY'S TREATMENT  Plan of care   PATIENT EDUCATION:  Education details: plan of care Person educated: Patient Education method: Explanation Education comprehension: verbalized understanding   HOME EXERCISE PROGRAM: To be started next visit  ASSESSMENT:  CLINICAL IMPRESSION: Patient is a 71 y.o. male who was seen today for physical therapy evaluation and treatment for low back pain. Symptoms began decades ago while serving in the [employer] but the pain has progressively worsened.  Pain is right > left side of low back and worsened with sitting upright, lying down and prolonged walking.  He also has intermittent right lower leg numbness.  The patient would benefit from PT to address trunk and hip range of motion deficits, strength asymmetries in lumbo/pelvic and hip regions and pain levels that are currently affecting activities of daily living at home including sitting, standing, sleeping, walking, lifting and performing yardwork.      OBJECTIVE IMPAIRMENTS decreased activity tolerance, decreased mobility, difficulty walking, decreased ROM, decreased strength, hypomobility, increased fascial restrictions, impaired perceived functional ability, and pain.   ACTIVITY LIMITATIONS carrying, lifting, bending, sitting, standing, squatting, sleeping, and locomotion level  PARTICIPATION LIMITATIONS: community activity and yard work  PERSONAL FACTORS Fitness, Time since onset of injury/illness/exacerbation, and 1-2 comorbidities: HTN,history of CVA  are also affecting patient's functional outcome.   REHAB POTENTIAL: Good  CLINICAL DECISION MAKING: Stable/uncomplicated  EVALUATION COMPLEXITY: Low   GOALS: Goals reviewed with patient? Yes  SHORT  TERM GOALS: Target date: 03/25/2022  The patient will demonstrate knowledge of basic self care strategies and exercises to promote healing   Baseline: Goal status: INITIAL  2.  The patient  will report a 30% improvement in pain levels with functional activities which are currently difficult including sitting, lying, walking Baseline:  Goal status: INITIAL  3.  Timed up and Go improved to 23 sec Baseline:  Goal status: INITIAL  4.  The patient will have improved hip flexion to 90 degrees, external rotation to 20 degrees and lumbar flexion to 40 degrees needed for yard work Baseline:  Goal status: INITIAL   LONG TERM GOALS: Target date: 04/22/2022  The patient will be independent in a safe self progression of a home exercise program to promote further recovery of function   Baseline:  Goal status: INITIAL  2.  The patient will report a 60% improvement in pain levels with functional activities which are currently difficult including sitting, lying down and walking Baseline:  Goal status: INITIAL  3.  The patient will have improved hip strength to at least 4/5 needed for standing, walking longer distances and doing yardwork Baseline:  Goal status: INITIAL  4.  The patient will have improved trunk flexor and extensor muscle strength to at least 4/5 needed for lifting medium weight objects such as grocery bags, laundry and luggage  Baseline:  Goal status: INITIAL  5.  The patient will have improved FOTO score to   50%    indicating improved function with less pain  Baseline:  Goal status: INITIAL    PLAN: PT FREQUENCY: 1-2x/week  PT DURATION: 8 weeks  PLANNED INTERVENTIONS: Therapeutic exercises, Therapeutic activity, Neuromuscular re-education, Patient/Family education, Self Care, Joint mobilization, Aquatic Therapy, Dry Needling, Spinal mobilization, Cryotherapy, Moist heat, Taping, Traction, Ultrasound, Ionotophoresis 4mg /ml Dexamethasone, and Manual therapy.  PLAN FOR  NEXT SESSION: low level hip and lumbar mobility exs and strengthening in supine, sitting and standing; pt not interested in DN at this time;  if unable to tolerate land PT will consider aquatic PT  Lavinia Sharps, PT 02/25/22 5:10 PM Phone: [phone] Fax: [phone]

## 2022-03-19 ENCOUNTER — Ambulatory Visit: Payer: No Typology Code available for payment source | Attending: Internal Medicine | Admitting: Physical Therapy

## 2022-03-19 DIAGNOSIS — M5459 Other low back pain: Secondary | ICD-10-CM | POA: Insufficient documentation

## 2022-03-19 DIAGNOSIS — M6281 Muscle weakness (generalized): Secondary | ICD-10-CM | POA: Insufficient documentation

## 2022-03-19 NOTE — Therapy (Signed)
OUTPATIENT PHYSICAL THERAPY THORACOLUMBAR PROGRESS NOTE   Patient Name: Connor Stevens MRN: [id number] DOB:[birth year], 71 y.o., male Today's Date: 03/19/2022   PT End of Session - 03/19/22 1527     Visit Number 2    Number of Visits 15    Date for PT Re-Evaluation 04/22/22    [facility]ization Type [facility] 15 visits 9/1-12/30    PT Start Time 1530    PT Stop Time 1610    PT Time Calculation (min) 40 min    Activity Tolerance Patient tolerated treatment well             Past Medical History:  Diagnosis Date   Depression    Dyslipidemia    Hypertension    IBS (irritable bowel syndrome)    Kidney stones    Tobacco abuse    Past Surgical History:  Procedure Laterality Date   colon polyp resection     Patient Active Problem List   Diagnosis Date Noted   Disturbance of skin sensation 03/15/2013   ANXIETY DISORDER 06/30/2010   DEPRESSION 06/30/2010   HEMORRHOIDS 06/30/2010   DIVERTICULAR DISEASE 06/30/2010   SLEEP APNEA 06/30/2010   FLATULENCE ERUCTATION AND GAS PAIN 06/30/2010   COLONIC POLYPS, ADENOMATOUS, HX OF 06/30/2010   ALCOHOL USE 12/23/2007   RECTAL BLEEDING 12/23/2007   ABDOMINAL PAIN-PERIUMBILICAL 12/23/2007    [facility]  REFERRING PROVIDER: Alvino Chapel MD  REFERRING DIAG: M54.50 low back pain  Rationale for Evaluation and Treatment Rehabilitation  THERAPY DIAG:  Back pain; weakness  ONSET DATE: 06/01/2021  SUBJECTIVE:                                                                                                                                                                                           SUBJECTIVE STATEMENT: Taking one day at a time.  Neck and LBP 9/10 today.  I used the massage chair at the gym.  I do a little treadmill and hydro massage.   PERTINENT HISTORY:  No spinal surgeries; right rotator cuff surgery 3 years ago Undergoing testing for abdominal pain (colonoscopy and endoscopy OK) Will be having biopsy for  prostate Has TENS unit at home CVA 5-6 years ago with reports of some memory deficits  Has some weights at home  PAIN:  Are you having pain? Yes NPRS scale: 9/10 Pain location: right > left low back; right lower leg numbness  Aggravating factors: lying down, sitting; more 1/4 mile walking Relieving factors: sit in recliner; massage belt, heating pad; back brace when mowing grass, trimming;  short distance OK walking   PRECAUTIONS: None  WEIGHT BEARING RESTRICTIONS No  FALLS:  Has patient fallen in last 6 months? No  OCCUPATION: retired  PLOF: Independent  PATIENT GOALS relieve pain and anxiety from this   OBJECTIVE:   DIAGNOSTIC FINDINGS:  Degenerative changes in lumbar spine;  mild OA bil hips  PATIENT SURVEYS:  FOTO 37% "extreme difficulty performing usual household activities"   COGNITION:  Overall cognitive status: Within functional limits for tasks assessed       MUSCLE LENGTH: Hamstrings: Right 30 deg; Left 30 deg Thomas test: Right 5 deg; Left 5 deg  POSTURE: decreased lumbar lordosis  PALPATION: Decreased fascial mobility lumbar region  LUMBAR ROM:   Active  A/PROM  eval  Flexion Fingertips to knees  Extension 5  Right lateral flexion 15  Left lateral flexion 15  Right rotation   Left rotation    (Blank rows = not tested)  LOWER EXTREMITY ROM:   Very stiff with bil hip mobility particularly hip internal and external rotation;  bil hip flexion 85 degrees bil    LOWER EXTREMITY MMT:  Able to heel and toe raise;  SLS 8 sec bil; able to rise sit to stand without UE assist but very slowly  MMT Right eval Left eval  Hip flexion    Hip extension 4- 4-  Hip abduction 4- 4-  Hip adduction    Hip internal rotation    Hip external rotation    Knee flexion    Knee extension 4 4  Ankle dorsiflexion 4 4  Ankle plantarflexion 4 4  Ankle inversion    Ankle eversion     (Blank rows = not tested) TRUNK STRENGTH:  Decreased activation of  transverse abdominus muscles; abdominals 4-/5; decreased activation of lumbar multifidi; trunk extensors 4-/5  LUMBAR SPECIAL TESTS:  + right SLR  FUNCTIONAL TESTS:  Timed up and go (TUG): 28 sec no UE use with sit to stand  GAIT:  Comments: decreased gait speed    TODAY'S TREATMENT  10/19: Seated HS stretch 30 sec right/left Seated hip flexor 30 sec right/left Seated hip rotator stretch 30 sec right/left Seated heel toe raise with 5# weight on knee 10x right/left Seated hip up and over low cone 5# weight on knee 10x right/left Seated 5# chops 10x each way Seatd 5# golf swings 10x Sit to stand from mat table no hands 6x Nu-Step L1 9 min with monitoring by therapist    PATIENT EDUCATION:  Education details: plan of care Person educated: Patient Education method: Explanation Education comprehension: verbalized understanding   HOME EXERCISE PROGRAM: Access Code: [email or web address] URL: [email or web address] Date: 03/19/2022 Prepared by: Lavinia Sharps  Exercises - Seated Hamstring Stretch  - 1 x daily - 7 x weekly - 1 sets - 2 reps - 30 hold - Seated Figure 4 Piriformis Stretch  - 1 x daily - 7 x weekly - 1 sets - 2 reps - 30 hold - Seated Hip Flexor Stretch  - 1 x daily - 7 x weekly - 1 sets - 2 reps - 30 hold - Sit to Stand Without Arm Support  - 1 x daily - 7 x weekly - 1 sets - 5-10 reps   ASSESSMENT:  CLINICAL IMPRESSION: Initiated low level mobility and core/LE strengthening ex's.  Verbal and demonstration of ex's to optimize effectiveness.  Reports pain level 6/10 towards the end of session (decreased from 9/10).      OBJECTIVE IMPAIRMENTS decreased activity tolerance, decreased mobility, difficulty walking, decreased ROM, decreased strength, hypomobility, increased fascial restrictions, impaired perceived functional ability, and pain.  ACTIVITY LIMITATIONS carrying, lifting, bending, sitting, standing, squatting, sleeping, and locomotion  level  PARTICIPATION LIMITATIONS: community activity and yard work  PERSONAL FACTORS Fitness, Time since onset of injury/illness/exacerbation, and 1-2 comorbidities: HTN,history of CVA  are also affecting patient's functional outcome.   REHAB POTENTIAL: Good  CLINICAL DECISION MAKING: Stable/uncomplicated  EVALUATION COMPLEXITY: Low   GOALS: Goals reviewed with patient? Yes  SHORT TERM GOALS: Target date: 03/25/2022  The patient will demonstrate knowledge of basic self care strategies and exercises to promote healing   Baseline: Goal status: INITIAL  2.  The patient will report a 30% improvement in pain levels with functional activities which are currently difficult including sitting, lying, walking Baseline:  Goal status: INITIAL  3.  Timed up and Go improved to 23 sec Baseline:  Goal status: INITIAL  4.  The patient will have improved hip flexion to 90 degrees, external rotation to 20 degrees and lumbar flexion to 40 degrees needed for yard work Baseline:  Goal status: INITIAL   LONG TERM GOALS: Target date: 04/22/2022  The patient will be independent in a safe self progression of a home exercise program to promote further recovery of function   Baseline:  Goal status: INITIAL  2.  The patient will report a 60% improvement in pain levels with functional activities which are currently difficult including sitting, lying down and walking Baseline:  Goal status: INITIAL  3.  The patient will have improved hip strength to at least 4/5 needed for standing, walking longer distances and doing yardwork Baseline:  Goal status: INITIAL  4.  The patient will have improved trunk flexor and extensor muscle strength to at least 4/5 needed for lifting medium weight objects such as grocery bags, laundry and luggage  Baseline:  Goal status: INITIAL  5.  The patient will have improved FOTO score to   50%    indicating improved function with less pain  Baseline:  Goal status:  INITIAL    PLAN: PT FREQUENCY: 1-2x/week  PT DURATION: 8 weeks  PLANNED INTERVENTIONS: Therapeutic exercises, Therapeutic activity, Neuromuscular re-education, Patient/Family education, Self Care, Joint mobilization, Aquatic Therapy, Dry Needling, Spinal mobilization, Cryotherapy, Moist heat, Taping, Traction, Ultrasound, Ionotophoresis 4mg /ml Dexamethasone, and Manual therapy.  PLAN FOR NEXT SESSION: low level hip and lumbar mobility exs and strengthening in supine, sitting and standing; pt not interested in DN at this time;  if unable to tolerate land PT will consider aquatic PT  Lavinia Sharps, PT 03/19/22 4:05 PM Phone: [phone] Fax: [phone]

## 2022-03-26 ENCOUNTER — Ambulatory Visit: Payer: No Typology Code available for payment source | Admitting: Physical Therapy

## 2022-04-01 ENCOUNTER — Ambulatory Visit: Payer: No Typology Code available for payment source | Attending: Internal Medicine | Admitting: Physical Therapy

## 2022-04-01 DIAGNOSIS — M6281 Muscle weakness (generalized): Secondary | ICD-10-CM | POA: Insufficient documentation

## 2022-04-01 DIAGNOSIS — M5459 Other low back pain: Secondary | ICD-10-CM | POA: Diagnosis not present

## 2022-04-01 NOTE — Therapy (Addendum)
OUTPATIENT PHYSICAL THERAPY THORACOLUMBAR PROGRESS NOTE/DISCHARGE SUMMARY   Patient Name: Connor Stevens MRN: [id number] DOB:January 20, 1951, 71 y.o., male Today's Date: 04/01/2022   PT End of Session - 04/01/22 1448     Visit Number 3    Number of Visits 15    Date for PT Re-Evaluation 04/22/22    Authorization Type [facility] 15 visits 9/1-12/30    PT Start Time 1445    PT Stop Time 1528    PT Time Calculation (min) 43 min    Activity Tolerance Patient tolerated treatment well             Past Medical History:  Diagnosis Date   Depression    Dyslipidemia    Hypertension    IBS (irritable bowel syndrome)    Kidney stones    Tobacco abuse    Past Surgical History:  Procedure Laterality Date   colon polyp resection     Patient Active Problem List   Diagnosis Date Noted   Disturbance of skin sensation 03/15/2013   ANXIETY DISORDER 06/30/2010   DEPRESSION 06/30/2010   HEMORRHOIDS 06/30/2010   DIVERTICULAR DISEASE 06/30/2010   SLEEP APNEA 06/30/2010   FLATULENCE ERUCTATION AND GAS PAIN 06/30/2010   COLONIC POLYPS, ADENOMATOUS, HX OF 06/30/2010   ALCOHOL USE 12/23/2007   RECTAL BLEEDING 12/23/2007   ABDOMINAL PAIN-PERIUMBILICAL 12/23/2007    [facility]  REFERRING PROVIDER: Alvino Chapel MD  REFERRING DIAG: M54.50 low back pain  Rationale for Evaluation and Treatment Rehabilitation  THERAPY DIAG:  Back pain; weakness  ONSET DATE: 06/01/2021  SUBJECTIVE:                                                                                                                                                                                           SUBJECTIVE STATEMENT: Going out of state to be with his sister who has cancer and not sure when coming back;  some back spasms while raking  PERTINENT HISTORY:  No spinal surgeries; right rotator cuff surgery 3 years ago Undergoing testing for abdominal pain (colonoscopy and endoscopy OK) Will be having biopsy for  prostate Has TENS unit at home CVA 5-6 years ago with reports of some memory deficits  Has some weights at home  PAIN:  Are you having pain? Yes NPRS scale: 7/10 Pain location: right > left low back; right lower leg numbness  Aggravating factors: lying down, sitting; more 1/4 mile walking Relieving factors: sit in recliner; massage belt, heating pad; back brace when mowing grass, trimming;  short distance OK walking   PRECAUTIONS: None  WEIGHT BEARING RESTRICTIONS No  FALLS:  Has patient fallen in last  6 months? No  OCCUPATION: retired  PLOF: Independent  PATIENT GOALS relieve pain and anxiety from this   OBJECTIVE:   DIAGNOSTIC FINDINGS:  Degenerative changes in lumbar spine;  mild OA bil hips  PATIENT SURVEYS:  FOTO 37% "extreme difficulty performing usual household activities"   COGNITION:  Overall cognitive status: Within functional limits for tasks assessed       MUSCLE LENGTH: Hamstrings: Right 30 deg; Left 30 deg Thomas test: Right 5 deg; Left 5 deg  POSTURE: decreased lumbar lordosis  PALPATION: Decreased fascial mobility lumbar region  LUMBAR ROM:   Active  A/PROM  eval 11/1  Flexion Fingertips to knees Fingers just below knees  Extension 5   Right lateral flexion 15   Left lateral flexion 15   Right rotation    Left rotation     (Blank rows = not tested)  LOWER EXTREMITY ROM:   Very stiff with bil hip mobility particularly hip internal and external rotation;  bil hip flexion 85 degrees bil    LOWER EXTREMITY MMT:  Able to heel and toe raise;  SLS 8 sec bil; able to rise sit to stand without UE assist but very slowly  MMT Right eval Left eval 11/1  Hip flexion     Hip extension 4- 4- 4-  Hip abduction 4- 4- 4-  Hip adduction     Hip internal rotation     Hip external rotation     Knee flexion     Knee extension 4 4 4   Ankle dorsiflexion 4 4 4   Ankle plantarflexion 4 4 4   Ankle inversion     Ankle eversion      (Blank rows =  not tested) TRUNK STRENGTH:  Decreased activation of transverse abdominus muscles; abdominals 4-/5; decreased activation of lumbar multifidi; trunk extensors 4-/5  LUMBAR SPECIAL TESTS:  + right SLR  FUNCTIONAL TESTS:  Timed up and go (TUG): 28 sec no UE use with sit to stand 11/1:  19 sec  GAIT:  Comments: decreased gait speed    TODAY'S TREATMENT  11/1: Nu-Step L2 10 min while discussing status TUG Seated HS stretch 30 sec right/left Seated hip flexor 30 sec right/left Seated hip rotator stretch 30 sec right/left Seated back stretch using rolling stool 6x Chair sit ups 10x Seated 5# chops 10x each way Seatd 5# golf swings 10x   10/19: Seated HS stretch 30 sec right/left Seated hip flexor 30 sec right/left Seated hip rotator stretch 30 sec right/left Seated heel toe raise with 5# weight on knee 10x right/left Seated hip up and over low cone 5# weight on knee 10x right/left Seated 5# chops 10x each way Seatd 5# golf swings 10x Sit to stand from mat table no hands 6x Nu-Step L1 9 min with monitoring by therapist    PATIENT EDUCATION:  Education details: plan of care Person educated: Patient Education method: Explanation Education comprehension: verbalized understanding   HOME EXERCISE PROGRAM: Access Code: [email or web address] URL: [email or web address] Date: 04/01/2022 Prepared by: Lavinia Sharps  Exercises - Seated Hamstring Stretch  - 1 x daily - 7 x weekly - 1 sets - 2 reps - 30 hold - Seated Figure 4 Piriformis Stretch  - 1 x daily - 7 x weekly - 1 sets - 2 reps - 30 hold - Seated Hip Flexor Stretch  - 1 x daily - 7 x weekly - 1 sets - 2 reps - 30 hold - Sit to Stand Without Arm Support  - 1 x daily -  7 x weekly - 1 sets - 5-10 reps - Seated Diagonal Chops with Medicine Ball  - 1 x daily - 7 x weekly - 1 sets - 10 reps - Modified Eccentric Sit Up in Backless Chair  - 1 x daily - 7 x weekly - 1 sets - 10 reps - Seated March  - 1 x daily - 7 x weekly -  1 sets - 10 reps  ASSESSMENT:  CLINICAL IMPRESSION: Limited progress toward goals secondary to only 3 visits. Good improvement in TUG time since start of care.  The patient will be traveling out of state to be with his sister who is ill and requests to be put on hold.  Patient provided with HEP and he will call to schedule further appts when he returns.  Discussed approval dates per [place].    OBJECTIVE IMPAIRMENTS decreased activity tolerance, decreased mobility, difficulty walking, decreased ROM, decreased strength, hypomobility, increased fascial restrictions, impaired perceived functional ability, and pain.   ACTIVITY LIMITATIONS carrying, lifting, bending, sitting, standing, squatting, sleeping, and locomotion level  PARTICIPATION LIMITATIONS: community activity and yard work  PERSONAL FACTORS Fitness, Time since onset of injury/illness/exacerbation, and 1-2 comorbidities: HTN,history of CVA  are also affecting patient's functional outcome.   REHAB POTENTIAL: Good  CLINICAL DECISION MAKING: Stable/uncomplicated  EVALUATION COMPLEXITY: Low   GOALS: Goals reviewed with patient? Yes  SHORT TERM GOALS: Target date: 03/25/2022  The patient will demonstrate knowledge of basic self care strategies and exercises to promote healing   Baseline: Goal status: met 11/1  2.  The patient will report a 30% improvement in pain levels with functional activities which are currently difficult including sitting, lying, walking Baseline:  Goal status: ongoing  3.  Timed up and Go improved to 23 sec Baseline:  Goal status: met 11/1  4.  The patient will have improved hip flexion to 90 degrees, external rotation to 20 degrees and lumbar flexion to 40 degrees needed for yard work Baseline:  Goal status: ongoing   LONG TERM GOALS: Target date: 04/22/2022  The patient will be independent in a safe self progression of a home exercise program to promote further recovery of function   Baseline:   Goal status: INITIAL  2.  The patient will report a 60% improvement in pain levels with functional activities which are currently difficult including sitting, lying down and walking Baseline:  Goal status: INITIAL  3.  The patient will have improved hip strength to at least 4/5 needed for standing, walking longer distances and doing yardwork Baseline:  Goal status: INITIAL  4.  The patient will have improved trunk flexor and extensor muscle strength to at least 4/5 needed for lifting medium weight objects such as grocery bags, laundry and luggage  Baseline:  Goal status: INITIAL  5.  The patient will have improved FOTO score to   50%    indicating improved function with less pain  Baseline:  Goal status: INITIAL    PLAN: PT FREQUENCY: 1-2x/week  PT DURATION: 8 weeks  PLANNED INTERVENTIONS: Therapeutic exercises, Therapeutic activity, Neuromuscular re-education, Patient/Family education, Self Care, Joint mobilization, Aquatic Therapy, Dry Needling, Spinal mobilization, Cryotherapy, Moist heat, Taping, Traction, Ultrasound, Ionotophoresis 4mg /ml Dexamethasone, and Manual therapy.  PLAN FOR NEXT SESSION: pt going to [place] sister sick with cancer;  will hold treatment;  discussed time frame approved by [place] until 12/30;  low level hip and lumbar mobility exs and strengthening in supine, sitting and standing; pt not interested in DN at this time;  if unable to tolerate land PT will consider aquatic PT  Lavinia Sharps, PT 04/01/22 4:44 PM Phone: [phone] Fax: [phone]    PHYSICAL THERAPY DISCHARGE SUMMARY  Visits from Start of Care: 3  Current functional level related to goals / functional outcomes: The patient has not returned since early November since traveling out of the state.  Will discharge from PT at this time.     Remaining deficits: As above   Education / Equipment: Initial HEP    Patient goals were not met. Patient is being discharged due to not  returning since the last visit.   Lavinia Sharps, PT 06/09/22 8:41 AM Phone: [phone] Fax: [phone]

## 2022-06-01 HISTORY — PX: COLONOSCOPY: SHX174

## 2022-06-01 HISTORY — PX: PROSTATE BIOPSY: SHX241

## 2023-02-02 ENCOUNTER — Ambulatory Visit (HOSPITAL_COMMUNITY)
Admission: EM | Admit: 2023-02-02 | Discharge: 2023-02-02 | Disposition: A | Discharge status: Home / Self Care | Payer: No Typology Code available for payment source

## 2023-02-02 ENCOUNTER — Encounter (HOSPITAL_COMMUNITY): Payer: Self-pay | Admitting: *Deleted

## 2023-02-02 DIAGNOSIS — M79662 Pain in left lower leg: Secondary | ICD-10-CM | POA: Diagnosis not present

## 2023-02-02 HISTORY — DX: Malignant neoplasm of prostate: C61

## 2023-02-02 NOTE — ED Notes (Signed)
Pt aware of doppler appt time and date

## 2023-02-02 NOTE — ED Provider Notes (Signed)
[facility]    CSN: [id number] Arrival date & time: 02/02/23  1338      History   Chief Complaint Chief Complaint  Patient presents with   Leg Pain    HPI Connor Stevens is a 72 y.o. male.   Patient here today for evaluation of left calf pain has had the last 4 to 5 days.  He states that he has pain while sitting and with movement.  He has tried using a topical cream and a TENS unit without relief.  He does have prescription for prednisone but has not taken in the last few days.  He was recently diagnosed with prostate cancer and has not started treatment yet.  He denies any fever.  He has not had any numbness or tingling.  He denies any injury.  He does not have shortness of breath.  The history is provided by the patient.    Past Medical History:  Diagnosis Date   Depression    Dyslipidemia    Hypertension    IBS (irritable bowel syndrome)    Kidney stones    Prostate cancer (HCC)    Tobacco abuse     Patient Active Problem List   Diagnosis Date Noted   Disturbance of skin sensation 03/15/2013   ANXIETY DISORDER 06/30/2010   DEPRESSION 06/30/2010   HEMORRHOIDS 06/30/2010   DIVERTICULAR DISEASE 06/30/2010   SLEEP APNEA 06/30/2010   FLATULENCE ERUCTATION AND GAS PAIN 06/30/2010   COLONIC POLYPS, ADENOMATOUS, HX OF 06/30/2010   ALCOHOL USE 12/23/2007   RECTAL BLEEDING 12/23/2007   ABDOMINAL PAIN-PERIUMBILICAL 12/23/2007    Past Surgical History:  Procedure Laterality Date   colon polyp resection     PROSTATE BIOPSY         Home Medications    Prior to Admission medications   Medication Sig Start Date End Date Taking? Authorizing Provider  amLODipine (NORVASC) 10 MG tablet Take by mouth. 07/06/22  Yes [provider]  DULoxetine (CYMBALTA) 60 MG capsule Take by mouth. 07/10/22  Yes [provider]  gabapentin (NEURONTIN) 300 MG capsule Start with 1 tab po qhs X 1 week, then increase to 1 tab po bid X 1 week then 1 tab po tid prn  02/23/17  Yes Andrena Mews, DO  predniSONE (DELTASONE) 10 MG tablet Take by mouth. 01/21/23  Yes [provider]  tamsulosin (FLOMAX) 0.4 MG CAPS capsule Take by mouth. 08/24/22  Yes [provider]  traZODone (DESYREL) 100 MG tablet Take by mouth. 02/23/22  Yes [provider]  acetaminophen (TYLENOL) 325 MG tablet Take 650 mg by mouth every 6 (six) hours as needed.    [provider]  cholecalciferol (VITAMIN D) 1000 units tablet Take 1,000 Units by mouth daily.    [provider]  cyclobenzaprine (FLEXERIL) 10 MG tablet Take by mouth. 10/28/12   [provider]  diazepam (VALIUM) 5 MG tablet Take one by mouth one hour prior to MRI, repeat as needed 11/08/17   Tarry Kos, MD  ibuprofen (ADVIL,MOTRIN) 800 MG tablet Take 800 mg by mouth every 6 (six) hours as needed.    [provider]  Omega-3 Fatty Acids (FISH OIL) 1000 MG CAPS Take by mouth.    [provider]    Family History Family History  Problem Relation Age of Onset   Heart disease Father    Kidney disease Father    Breast cancer Mother    Stroke Brother    Lung cancer  Brother    Breast cancer Sister    Breast cancer Sister    Colon cancer Neg Hx    Stomach cancer Neg Hx    Esophageal cancer Neg Hx    Rectal cancer Neg Hx    Liver cancer Neg Hx     Social History Social History   Tobacco Use   Smoking status: Every Day    Current packs/day: 1.00    Average packs/day: 1 pack/day for 40.0 years (40.0 ttl pk-yrs)    Types: Cigarettes   Smokeless tobacco: Never  Substance Use Topics   Alcohol use: No    Comment: stopped about a month ago   Drug use: No     Allergies   Egg-derived products and Latex   Review of Systems Review of Systems  Constitutional:  Negative for chills and fever.  Eyes:  Negative for discharge and redness.  Respiratory:  Negative for shortness of breath.   Musculoskeletal:  Positive for myalgias. Negative for joint  swelling.  Skin:  Negative for color change and wound.  Neurological:  Negative for numbness.     Physical Exam Triage Vital Signs ED Triage Vitals  Encounter Vitals Group     BP      Systolic BP Percentile      Diastolic BP Percentile      Pulse      Resp      Temp      Temp src      SpO2      Weight      Height      Head Circumference      Peak Flow      Pain Score      Pain Loc      Pain Education      Exclude from Growth Chart    No data found.  Updated Vital Signs BP 127/75 (BP Location: Right Arm)   Pulse 71   Temp 97.9 F (36.6 C) (Oral)   Resp 18   SpO2 97%       Physical Exam Vitals and nursing note reviewed.  Constitutional:      General: He is not in acute distress.    Appearance: Normal appearance. He is not ill-appearing.  HENT:     Head: Normocephalic and atraumatic.  Eyes:     Conjunctiva/sclera: Conjunctivae normal.  Cardiovascular:     Rate and Rhythm: Normal rate.  Pulmonary:     Effort: Pulmonary effort is normal. No respiratory distress.  Musculoskeletal:     Comments: Tenderness to palpation diffusely to left calf with no erythema or swelling appreciated.  Neurological:     Mental Status: He is alert.  Psychiatric:        Mood and Affect: Mood normal.        Behavior: Behavior normal.        Thought Content: Thought content normal.      UC Treatments / Results  Labs (all labs ordered are listed, but only abnormal results are displayed) Labs Reviewed - No data to display  EKG   Radiology No results found.  Procedures Procedures (including critical care time)  Medications Ordered in UC Medications - No data to display  Initial Impression / Assessment and Plan / UC Course  I have reviewed the triage vital signs and the nursing notes.  Pertinent labs & imaging results that were available during my care of the patient were reviewed by me and considered in my medical decision making (see  chart for details).     Ultrasound ordered to rule out DVT.  Will await results further recommendation.  Encouraged follow-up in the ED with any worsening or concerning symptoms.  Final Clinical Impressions(s) / UC Diagnoses   Final diagnoses:  Pain of left calf   Discharge Instructions   None    ED Prescriptions   None    PDMP not reviewed this encounter.   Tomi Bamberger, PA-C 02/02/23 1454

## 2023-02-02 NOTE — ED Triage Notes (Signed)
Pt states he has left calf pain x 4-5 days. He has been using some cream and the TENS unit but not helping. He does have a rx for prednisone that he takes PRN he took it 2 days ago. Just hx with prostate cancer on 01/15/2023 not started treatment.

## 2023-02-03 ENCOUNTER — Ambulatory Visit (HOSPITAL_COMMUNITY): Payer: Medicare Other

## 2023-02-03 ENCOUNTER — Telehealth: Payer: Self-pay

## 2023-02-03 DIAGNOSIS — G4700 Insomnia, unspecified: Secondary | ICD-10-CM | POA: Insufficient documentation

## 2023-02-03 DIAGNOSIS — R972 Elevated prostate specific antigen [PSA]: Secondary | ICD-10-CM | POA: Insufficient documentation

## 2023-02-03 DIAGNOSIS — R918 Other nonspecific abnormal finding of lung field: Secondary | ICD-10-CM | POA: Insufficient documentation

## 2023-02-03 DIAGNOSIS — H903 Sensorineural hearing loss, bilateral: Secondary | ICD-10-CM | POA: Insufficient documentation

## 2023-02-03 DIAGNOSIS — H40009 Preglaucoma, unspecified, unspecified eye: Secondary | ICD-10-CM | POA: Insufficient documentation

## 2023-02-03 DIAGNOSIS — Z0289 Encounter for other administrative examinations: Secondary | ICD-10-CM | POA: Insufficient documentation

## 2023-02-03 DIAGNOSIS — F17200 Nicotine dependence, unspecified, uncomplicated: Secondary | ICD-10-CM | POA: Insufficient documentation

## 2023-02-03 DIAGNOSIS — Z122 Encounter for screening for malignant neoplasm of respiratory organs: Secondary | ICD-10-CM | POA: Insufficient documentation

## 2023-02-03 DIAGNOSIS — M545 Low back pain, unspecified: Secondary | ICD-10-CM | POA: Insufficient documentation

## 2023-02-03 DIAGNOSIS — Z712 Person consulting for explanation of examination or test findings: Secondary | ICD-10-CM | POA: Insufficient documentation

## 2023-02-03 DIAGNOSIS — K30 Functional dyspepsia: Secondary | ICD-10-CM | POA: Insufficient documentation

## 2023-02-03 DIAGNOSIS — L603 Nail dystrophy: Secondary | ICD-10-CM | POA: Insufficient documentation

## 2023-02-03 DIAGNOSIS — R7309 Other abnormal glucose: Secondary | ICD-10-CM | POA: Insufficient documentation

## 2023-02-03 DIAGNOSIS — F1021 Alcohol dependence, in remission: Secondary | ICD-10-CM | POA: Insufficient documentation

## 2023-02-03 DIAGNOSIS — E785 Hyperlipidemia, unspecified: Secondary | ICD-10-CM | POA: Insufficient documentation

## 2023-02-03 DIAGNOSIS — R7303 Prediabetes: Secondary | ICD-10-CM | POA: Insufficient documentation

## 2023-02-03 DIAGNOSIS — F339 Major depressive disorder, recurrent, unspecified: Secondary | ICD-10-CM | POA: Insufficient documentation

## 2023-02-03 DIAGNOSIS — K2270 Barrett's esophagus without dysplasia: Secondary | ICD-10-CM | POA: Insufficient documentation

## 2023-02-03 DIAGNOSIS — J300 Vasomotor rhinitis: Secondary | ICD-10-CM | POA: Insufficient documentation

## 2023-02-03 DIAGNOSIS — Z01818 Encounter for other preprocedural examination: Secondary | ICD-10-CM | POA: Insufficient documentation

## 2023-02-03 DIAGNOSIS — J310 Chronic rhinitis: Secondary | ICD-10-CM | POA: Insufficient documentation

## 2023-02-03 DIAGNOSIS — M25519 Pain in unspecified shoulder: Secondary | ICD-10-CM | POA: Insufficient documentation

## 2023-02-03 DIAGNOSIS — F411 Generalized anxiety disorder: Secondary | ICD-10-CM | POA: Insufficient documentation

## 2023-02-03 DIAGNOSIS — Z461 Encounter for fitting and adjustment of hearing aid: Secondary | ICD-10-CM | POA: Insufficient documentation

## 2023-02-03 DIAGNOSIS — R109 Unspecified abdominal pain: Secondary | ICD-10-CM | POA: Insufficient documentation

## 2023-02-03 DIAGNOSIS — R0683 Snoring: Secondary | ICD-10-CM | POA: Insufficient documentation

## 2023-02-03 DIAGNOSIS — I10 Essential (primary) hypertension: Secondary | ICD-10-CM | POA: Insufficient documentation

## 2023-02-03 DIAGNOSIS — M542 Cervicalgia: Secondary | ICD-10-CM | POA: Insufficient documentation

## 2023-02-03 DIAGNOSIS — G4730 Sleep apnea, unspecified: Secondary | ICD-10-CM | POA: Insufficient documentation

## 2023-02-03 DIAGNOSIS — Z7189 Other specified counseling: Secondary | ICD-10-CM | POA: Insufficient documentation

## 2023-02-03 DIAGNOSIS — N401 Enlarged prostate with lower urinary tract symptoms: Secondary | ICD-10-CM | POA: Insufficient documentation

## 2023-02-03 DIAGNOSIS — M2020 Hallux rigidus, unspecified foot: Secondary | ICD-10-CM | POA: Insufficient documentation

## 2023-02-03 DIAGNOSIS — E559 Vitamin D deficiency, unspecified: Secondary | ICD-10-CM | POA: Insufficient documentation

## 2023-02-03 DIAGNOSIS — J449 Chronic obstructive pulmonary disease, unspecified: Secondary | ICD-10-CM | POA: Insufficient documentation

## 2023-02-03 NOTE — Telephone Encounter (Signed)
Called pt to find out if he was able to make his appointment for doppler to rule out DVT. Pt did not answer, left a message for him to call us back.

## 2023-03-01 NOTE — Progress Notes (Signed)
GU Location of Tumor / Histology: Prostate Ca  If Prostate Cancer, Gleason Score is (3 + 4) and PSA is (17.20 on 11/2022)  Biopsies      08/18/2021 Michaelle Copas, FNP MR Prostate with/without Contrast CLINICAL DATA: Elevated PSA.   IMPRESSION: 9 mm peripheral zone lesion in the left posteromedial mid gland. PI-RADS 3 (v2.1): Intermediate (clinically significant cancer equivocal).  No evidence of extracapsular extension or pelvic metastatic disease.  (I have post-processed this exam in the DynaCAD application for potential fusion-guided biopsy.)  Past/Anticipated interventions by urology, if any: 03/25/2023 with Dr. Marlou Porch.  Past/Anticipated interventions by medical oncology, if any: NA  Weight changes, if any:  No  IPSS:  21 SHIM:  14  Bowel/Bladder complaints, if any:  Urine frequency/ urgency.  No bowel issues.  Nausea/Vomiting, if any: No  Pain issues, if any:  8/10 lower back pain takes Tylenol as needed.  SAFETY ISSUES: Prior radiation? No Pacemaker/ICD? No Possible current pregnancy? Male Is the patient on methotrexate? No  Current Complaints / other details:

## 2023-03-05 ENCOUNTER — Ambulatory Visit
Admission: RE | Admit: 2023-03-05 | Discharge: 2023-03-05 | Disposition: A | Discharge status: Home / Self Care | Payer: No Typology Code available for payment source | Source: Ambulatory Visit | Attending: Radiation Oncology | Admitting: Radiation Oncology

## 2023-03-05 ENCOUNTER — Encounter: Payer: Self-pay | Admitting: Radiation Oncology

## 2023-03-05 VITALS — BP 134/72 | HR 72 | Temp 97.5°F | Resp 18 | Ht 67.0 in | Wt 164.4 lb

## 2023-03-05 DIAGNOSIS — Z79899 Other long term (current) drug therapy: Secondary | ICD-10-CM | POA: Insufficient documentation

## 2023-03-05 DIAGNOSIS — K589 Irritable bowel syndrome without diarrhea: Secondary | ICD-10-CM | POA: Insufficient documentation

## 2023-03-05 DIAGNOSIS — E785 Hyperlipidemia, unspecified: Secondary | ICD-10-CM | POA: Diagnosis not present

## 2023-03-05 DIAGNOSIS — C61 Malignant neoplasm of prostate: Secondary | ICD-10-CM | POA: Diagnosis present

## 2023-03-05 DIAGNOSIS — N200 Calculus of kidney: Secondary | ICD-10-CM | POA: Diagnosis not present

## 2023-03-05 DIAGNOSIS — Z801 Family history of malignant neoplasm of trachea, bronchus and lung: Secondary | ICD-10-CM | POA: Diagnosis not present

## 2023-03-05 DIAGNOSIS — F17210 Nicotine dependence, cigarettes, uncomplicated: Secondary | ICD-10-CM | POA: Diagnosis not present

## 2023-03-05 DIAGNOSIS — Z803 Family history of malignant neoplasm of breast: Secondary | ICD-10-CM | POA: Diagnosis not present

## 2023-03-05 DIAGNOSIS — I10 Essential (primary) hypertension: Secondary | ICD-10-CM | POA: Insufficient documentation

## 2023-03-05 HISTORY — DX: Elevated prostate specific antigen (PSA): R97.20

## 2023-03-05 NOTE — Progress Notes (Signed)
Radiation Oncology         [phone] ________________________________  Initial Outpatient Consultation  Name: Connor Stevens MRN: [id number]  Date: 03/05/2023  DOB: September 22, 1950  [id number], Anabel Halon, MD  Shelly Rubenstein*   REFERRING PHYSICIAN: Shelly Rubenstein*  DIAGNOSIS: 72 y.o. gentleman with Stage T1c adenocarcinoma of the prostate with Gleason score of 3+4, and PSA of 17.2.    ICD-10-CM   1. Malignant neoplasm of prostate (HCC)  C61       HISTORY OF PRESENT ILLNESS: Connor Stevens is a 72 y.o. male with a diagnosis of prostate cancer.  He has a history of elevated PSA since at least 2018 when it was 10.2 on routine labs with his PCP at the [facility].  The PSA has fluctuated over the years but steadily risen over the past 2 years, from 14.2 in 20 21-15.0 in January 2022, 18.9 in January 2023 and 17.1 in March 2023.  He had a prostate MRI on 08/18/2021 that showed a 9 mm PI-RADS 3 lesion in the left posterior medial mid gland.  Accordingly, he was referred for evaluation in urology by Dr. Retta Diones on 04/29/2022,  digital rectal examination performed at that time showed no discrete nodularity or concerning findings.  He was having significant LUTS despite taking Flomax daily so this was increased to twice daily.  The recommendation was to proceed with a prostate biopsy at that time but the patient was hesitant and did not follow-up as scheduled.  He returned to the urology office and met with Dr. Liliane Shi on 11/30/2022.  DRE remained without nodularity or concerning findings and a repeat PSA that day was stable at 17.2.  The patient agreed to proceed with an MRI fusion transrectal ultrasound with 16 biopsies of the prostate on 01/15/2023.  The prostate volume measured 45 cc.  Out of 16 core biopsies, 8 were positive.  The maximum Gleason score was 3+4, and this was seen in 4 of 6 cores on the left and 4 of 4 samples from the MRI ROI.  The patient reviewed the biopsy results with his  urologist and he has kindly been referred today for discussion of potential radiation treatment options.  He is also scheduled to meet with Dr. Marlou Porch on 03/25/2023 to discuss surgical treatment options.  He has not had any imaging for disease staging.   PREVIOUS RADIATION THERAPY: No  PAST MEDICAL HISTORY:  Past Medical History:  Diagnosis Date   Depression    Dyslipidemia    Elevated PSA    Hypertension    IBS (irritable bowel syndrome)    Kidney stones    Prostate cancer (HCC)    Tobacco abuse       PAST SURGICAL HISTORY: Past Surgical History:  Procedure Laterality Date   colon polyp resection     PROSTATE BIOPSY      FAMILY HISTORY:  Family History  Problem Relation Age of Onset   Heart disease Father    Kidney disease Father    Breast cancer Mother    Stroke Brother    Lung cancer Brother    Breast cancer Sister    Breast cancer Sister    Colon cancer Neg Hx    Stomach cancer Neg Hx    Esophageal cancer Neg Hx    Rectal cancer Neg Hx    Liver cancer Neg Hx     SOCIAL HISTORY:  Social History   Socioeconomic History   Marital status: Single    Spouse name: Not on  file   Number of children: 2   Years of education: COLLEGE   Highest education level: Not on file  Occupational History   Occupation: retired  Tobacco Use   Smoking status: Every Day    Current packs/day: 1.00    Average packs/day: 1 pack/day for 40.0 years (40.0 ttl pk-yrs)    Types: Cigarettes   Smokeless tobacco: Never  Vaping Use   Vaping status: Never Used  Substance and Sexual Activity   Alcohol use: No    Comment: stopped about a month ago   Drug use: No   Sexual activity: Not Currently  Other Topics Concern   Not on file  Social History Narrative   Not on file   Social Determinants of Health   Financial Resource Strain: Not on file  Food Insecurity: No Food Insecurity (03/05/2023)   Hunger Vital Sign    Worried About Running Out of Food in the Last Year: Never true     Ran Out of Food in the Last Year: Never true  Transportation Needs: No Transportation Needs (03/05/2023)   PRAPARE - Administrator, Civil Service (Medical): No    Lack of Transportation (Non-Medical): No  Physical Activity: Not on file  Stress: Not on file  Social Connections: Unknown (10/13/2021)   Received from [facility]   Social Network    Social Network: Not on file  Intimate Partner Violence: Not At Risk (03/05/2023)   Humiliation, Afraid, Rape, and Kick questionnaire    Fear of Current or Ex-Partner: No    Emotionally Abused: No    Physically Abused: No    Sexually Abused: No    ALLERGIES: Egg-derived products and Latex  MEDICATIONS:  Current Outpatient Medications  Medication Sig Dispense Refill   atorvastatin (LIPITOR) 20 MG tablet Take by mouth.     diazepam (VALIUM) 10 MG tablet Take 10 mg by mouth as directed.     dicyclomine (BENTYL) 20 MG tablet Take by mouth.     fluticasone (FLONASE) 50 MCG/ACT nasal spray Place into the nose.     Multiple Vitamin (MULTI-VITAMIN) tablet Take 1 tablet by mouth daily.     omeprazole (PRILOSEC) 40 MG capsule Take by mouth.     sildenafil (VIAGRA) 100 MG tablet Take by mouth.     acetaminophen (TYLENOL) 325 MG tablet Take 650 mg by mouth every 6 (six) hours as needed.     amLODipine (NORVASC) 10 MG tablet Take by mouth.     cholecalciferol (VITAMIN D) 1000 units tablet Take 1,000 Units by mouth daily.     cyclobenzaprine (FLEXERIL) 10 MG tablet Take by mouth.     DULoxetine (CYMBALTA) 60 MG capsule Take by mouth.     gabapentin (NEURONTIN) 300 MG capsule Start with 1 tab po qhs X 1 week, then increase to 1 tab po bid X 1 week then 1 tab po tid prn 90 capsule 1   ibuprofen (ADVIL,MOTRIN) 800 MG tablet Take 800 mg by mouth every 6 (six) hours as needed.     Omega-3 Fatty Acids (FISH OIL) 1000 MG CAPS Take by mouth.     predniSONE (DELTASONE) 10 MG tablet Take by mouth.     tamsulosin (FLOMAX) 0.4 MG CAPS  capsule Take by mouth.     traZODone (DESYREL) 100 MG tablet Take by mouth.     No current facility-administered medications for this encounter.    REVIEW OF SYSTEMS:  On review of systems, the patient reports that  he is doing well overall. He denies any chest pain, shortness of breath, cough, fevers, chills, night sweats, unintended weight changes. He denies any bowel disturbances, and denies abdominal pain, nausea or vomiting. He denies any new musculoskeletal or joint aches or pains. His IPSS was 21, indicating severe urinary symptoms despite taking Flomax twice daily as prescribed. His SHIM was 14, indicating he has moderate erectile dysfunction that responds to Viagra as needed. A complete review of systems is obtained and is otherwise negative.    PHYSICAL EXAM:  Wt Readings from Last 3 Encounters:  03/05/23 164 lb 6 oz (74.6 kg)  02/23/17 167 lb 9.6 oz (76 kg)  04/09/16 165 lb 4 oz (75 kg)   Temp Readings from Last 3 Encounters:  03/05/23 (!) 97.5 F (36.4 C) (Temporal)  02/02/23 97.9 F (36.6 C) (Oral)  10/28/21 98.5 F (36.9 C) (Oral)   BP Readings from Last 3 Encounters:  03/05/23 134/72  02/02/23 127/75  10/28/21 139/80   Pulse Readings from Last 3 Encounters:  03/05/23 72  02/02/23 71  10/28/21 75   Pain Assessment Pain Score: 8  Pain Loc: Back/10  In general this is a well appearing African-American male in no acute distress. He's alert and oriented x4 and appropriate throughout the examination. Cardiopulmonary assessment is negative for acute distress, and he exhibits normal effort.     KPS = 100  100 - Normal; no complaints; no evidence of disease. 90   - Able to carry on normal activity; minor signs or symptoms of disease. 80   - Normal activity with effort; some signs or symptoms of disease. 98   - Cares for self; unable to carry on normal activity or to do active work. 60   - Requires occasional assistance, but is able to care for most of his personal  needs. 50   - Requires considerable assistance and frequent medical care. 40   - Disabled; requires special care and assistance. 30   - Severely disabled; hospital admission is indicated although death not imminent. 20   - Very sick; hospital admission necessary; active supportive treatment necessary. 10   - Moribund; fatal processes progressing rapidly. 0     - Dead  Karnofsky DA, Abelmann WH, Craver LS and Burchenal [facility] [phone]) The use of the nitrogen mustards in the palliative treatment of carcinoma: with particular reference to bronchogenic carcinoma Cancer 1 [phone]  LABORATORY DATA:  Lab Results  Component Value Date   WBC 9.4 04/09/2016   HGB 16.1 04/09/2016   HCT 48.1 04/09/2016   MCV 82.5 04/09/2016   PLT 206.0 04/09/2016   Lab Results  Component Value Date   NA 138 04/09/2016   K 4.8 04/09/2016   CL 104 04/09/2016   CO2 25 04/09/2016   Lab Results  Component Value Date   ALT 18 04/09/2016   AST 22 04/09/2016   ALKPHOS 78 04/09/2016   BILITOT 0.4 04/09/2016     RADIOGRAPHY: No results found.    IMPRESSION/PLAN: 1. 72 y.o. gentleman with Stage T1c adenocarcinoma of the prostate with Gleason Score of 3+4, and PSA of 17.2. We discussed the patient's workup and outlined the nature of prostate cancer in this setting. The patient's T stage, Gleason's score, and PSA put him into the unfavorable intermediate risk group.  We discussed the NCCN guideline recommendation for disease staging imaging in this setting but he would like to wait and discuss this further with Dr. Marlou Porch on 03/25/2023.  Based on the current  available information from his workup to date, he appears to be eligible for a variety of potential treatment options including brachytherapy, 5.5 weeks of external radiation, or prostatectomy. We discussed the available radiation techniques, and focused on the details and logistics of delivery. The patient is not an ideal candidate for brachytherapy with an IPSS score of  21 despite medical therapy with Flomax BID. Therefore, we discussed and outlined the risks, benefits, short and long-term effects associated with daily external beam radiotherapy and compared and contrasted these with prostatectomy. We discussed the role of SpaceOAR gel in reducing the rectal toxicity associated with radiotherapy.  He appears to have a good understanding of his disease and our treatment recommendations which are of curative intent.  He was encouraged to ask questions that were answered to his stated satisfaction.  At the conclusion of our conversation, the patient is interested in learning more about his surgical options prior to finalizing a treatment decision.  He is scheduled for a consult visit with Dr. Marlou Porch on 03/25/2023 and plans to make a decision shortly thereafter.  We will share our discussion with Dr. Liliane Shi, Dr. Marlou Porch and his treatment team at the [facility].  He has our contact information and will let us know if he ultimately elects to proceed with the 5.5 week course of daily external beam radiotherapy and we will move forward with treatment planning accordingly, at that time.  We enjoyed meeting him today and look forward to continuing to participate in his care.  He knows that he is welcome to call with any questions or concerns related to the treatment options discussed today.  We personally spent 70 minutes in this encounter including chart review, reviewing radiological studies, meeting face-to-face with the patient, entering orders and completing documentation.    Marguarite Arbour, PA-C    Margaretmary Dys, MD  [facility]  Radiation Oncology Direct Dial: [phone]  Fax: [phone] [email or web address]  Skype  [email or web address]

## 2023-03-05 NOTE — Addendum Note (Signed)
Encounter addended by: Roel Cluck, RN on: 03/05/2023 10:22 AM  Actions taken: Charge Capture section accepted

## 2023-03-12 NOTE — Progress Notes (Signed)
RN left message with direct contact information for any follow up questions since his consult on 10/4.   Patient has surgical consult on 03/25/23.  RN will follow up after consult to ensure treatment decision is finalized.

## 2023-03-26 NOTE — Progress Notes (Signed)
Patient had surgical consult on 10/24 and via notes has decided to proceed with daily radiation.   RN left message with patient for call back to review next steps.

## 2023-04-12 ENCOUNTER — Other Ambulatory Visit: Payer: Self-pay | Admitting: Urology

## 2023-04-12 ENCOUNTER — Telehealth: Payer: Self-pay | Admitting: *Deleted

## 2023-04-12 NOTE — Progress Notes (Signed)
Per MD recommendations PSA will need to be obtained in December prior to starting radiation treatment in January.    RN spoke with [facility], they will obtain order and contact once scheduled.    Will continue to follow.

## 2023-04-12 NOTE — Telephone Encounter (Signed)
Called patient to inform of fid. marker and space oar placement on 06-09-23 and his sim appt.on 06-11-23- arrival time- 2:45 pm @ [facility], informed patient to arrive with a full blader, spoke with patient and he is aware of these appts. and the instructions

## 2023-04-16 NOTE — Progress Notes (Signed)
Patient is scheduled for repeat PSA on 05/14/23 at [facility] to obtain baseline prior to radiation treatment in January.

## 2023-05-18 NOTE — Progress Notes (Signed)
Patient had repeat PSA at [facility] on 12/13 prior to upcoming daily radiation in January.  Results of PSA 19.60.  Providers aware.

## 2023-05-21 ENCOUNTER — Other Ambulatory Visit (HOSPITAL_COMMUNITY): Payer: Self-pay | Admitting: Urology

## 2023-05-21 DIAGNOSIS — C61 Malignant neoplasm of prostate: Secondary | ICD-10-CM

## 2023-06-07 ENCOUNTER — Encounter (HOSPITAL_COMMUNITY)
Admission: RE | Admit: 2023-06-07 | Discharge: 2023-06-07 | Disposition: A | Discharge status: Home / Self Care | Payer: No Typology Code available for payment source | Source: Ambulatory Visit | Attending: Urology | Admitting: Urology

## 2023-06-07 DIAGNOSIS — C61 Malignant neoplasm of prostate: Secondary | ICD-10-CM | POA: Insufficient documentation

## 2023-06-07 MED ORDER — FLOTUFOLASTAT F 18 GALLIUM 296-5846 MBQ/ML IV SOLN
8.0000 | Freq: Once | INTRAVENOUS | Status: AC
  Administered 2023-06-07: 8.3 via INTRAVENOUS
  Filled 2023-06-07: qty 8

## 2023-06-08 ENCOUNTER — Encounter (HOSPITAL_BASED_OUTPATIENT_CLINIC_OR_DEPARTMENT_OTHER): Payer: Self-pay | Admitting: Urology

## 2023-06-08 ENCOUNTER — Other Ambulatory Visit: Payer: Self-pay

## 2023-06-08 NOTE — Anesthesia Preprocedure Evaluation (Addendum)
 Anesthesia Evaluation  Patient identified by MRN, date of birth, ID band Patient awake    Reviewed: Allergy & Precautions, NPO status , Patient's Chart, lab work & pertinent test results  Airway Mallampati: II  TM Distance: >3 FB Neck ROM: Full    Dental no notable dental hx. (+) Teeth Intact, Dental Advisory Given   Pulmonary sleep apnea , COPD, Current SmokerPatient did not abstain from smoking.   Pulmonary exam normal breath sounds clear to auscultation       Cardiovascular hypertension, Pt. on medications and Pt. on home beta blockers Normal cardiovascular exam Rhythm:Regular Rate:Normal     Neuro/Psych  PSYCHIATRIC DISORDERS Anxiety Depression       GI/Hepatic Neg liver ROS,,,  Endo/Other    Renal/GU    Prostate CA    Musculoskeletal  (+) Arthritis ,    Abdominal   Peds  Hematology   Anesthesia Other Findings All: Latex  Reproductive/Obstetrics                             Anesthesia Physical Anesthesia Plan  ASA: 3  Anesthesia Plan: MAC   Post-op Pain Management: Tylenol  PO (pre-op)* and Precedex    Induction: Intravenous  PONV Risk Score and Plan: 2 and Treatment may vary due to age or medical condition, Midazolam and Ondansetron   Airway Management Planned: Natural Airway and Nasal Cannula  Additional Equipment: None  Intra-op Plan:   Post-operative Plan:   Informed Consent: I have reviewed the patients History and Physical, chart, labs and discussed the procedure including the risks, benefits and alternatives for the proposed anesthesia with the patient or authorized representative who has indicated his/her understanding and acceptance.     Dental advisory given  Plan Discussed with: CRNA and Anesthesiologist  Anesthesia Plan Comments:         Anesthesia Quick Evaluation

## 2023-06-08 NOTE — Progress Notes (Signed)
 Spoke w/ via phone for pre-op interview---Tyreque Lab needs dos----   ISTAT, EKG      Lab results------none COVID test -----patient states asymptomatic no test needed Arrive at -------0815 on Wednesday, 06/09/23 NPO after MN NO Solid Food.  Clear liquids from MN until---0715 Med rec completed Medications to take morning of surgery -----Amlodipine, Prilosec Diabetic medication -----n/a Patient instructed no nail polish to be worn day of surgery Patient instructed to bring photo id and insurance card day of surgery Patient aware to have Driver (ride ) / caregiver    for 24 hours after surgery - brother, [facility] Patient Special Instructions -----Do Fleet's enema on the night before surgery. Do your best not to smoke for 24 hours before surgery. Pre-Op special Instructions -----Patient is aware that partial dentures may not be worn into OR. Patient verbalized understanding of instructions that were given at this phone interview. Patient denies chest pain, sob, fever, cough at the interview.

## 2023-06-08 NOTE — Progress Notes (Signed)
 STAT read requested for recent PSMA PET.

## 2023-06-09 ENCOUNTER — Encounter (HOSPITAL_BASED_OUTPATIENT_CLINIC_OR_DEPARTMENT_OTHER)
Admission: RE | Disposition: A | Discharge status: Home / Self Care | Payer: Self-pay | Source: Home / Self Care | Attending: Urology

## 2023-06-09 ENCOUNTER — Ambulatory Visit (HOSPITAL_BASED_OUTPATIENT_CLINIC_OR_DEPARTMENT_OTHER): Payer: No Typology Code available for payment source | Admitting: Anesthesiology

## 2023-06-09 ENCOUNTER — Ambulatory Visit (HOSPITAL_BASED_OUTPATIENT_CLINIC_OR_DEPARTMENT_OTHER)
Admission: RE | Admit: 2023-06-09 | Discharge: 2023-06-09 | Disposition: A | Discharge status: Home / Self Care | Payer: No Typology Code available for payment source | Attending: Urology | Admitting: Urology

## 2023-06-09 ENCOUNTER — Encounter (HOSPITAL_BASED_OUTPATIENT_CLINIC_OR_DEPARTMENT_OTHER): Payer: Self-pay | Admitting: Urology

## 2023-06-09 ENCOUNTER — Other Ambulatory Visit: Payer: Self-pay

## 2023-06-09 DIAGNOSIS — I10 Essential (primary) hypertension: Secondary | ICD-10-CM | POA: Insufficient documentation

## 2023-06-09 DIAGNOSIS — F32A Depression, unspecified: Secondary | ICD-10-CM | POA: Diagnosis not present

## 2023-06-09 DIAGNOSIS — G4730 Sleep apnea, unspecified: Secondary | ICD-10-CM | POA: Diagnosis not present

## 2023-06-09 DIAGNOSIS — M1990 Unspecified osteoarthritis, unspecified site: Secondary | ICD-10-CM | POA: Insufficient documentation

## 2023-06-09 DIAGNOSIS — F419 Anxiety disorder, unspecified: Secondary | ICD-10-CM | POA: Insufficient documentation

## 2023-06-09 DIAGNOSIS — F17210 Nicotine dependence, cigarettes, uncomplicated: Secondary | ICD-10-CM

## 2023-06-09 DIAGNOSIS — F17200 Nicotine dependence, unspecified, uncomplicated: Secondary | ICD-10-CM | POA: Diagnosis not present

## 2023-06-09 DIAGNOSIS — J449 Chronic obstructive pulmonary disease, unspecified: Secondary | ICD-10-CM

## 2023-06-09 DIAGNOSIS — C61 Malignant neoplasm of prostate: Secondary | ICD-10-CM | POA: Diagnosis present

## 2023-06-09 DIAGNOSIS — Z01818 Encounter for other preprocedural examination: Secondary | ICD-10-CM

## 2023-06-09 HISTORY — PX: GOLD SEED IMPLANT: SHX6343

## 2023-06-09 HISTORY — DX: Unspecified osteoarthritis, unspecified site: M19.90

## 2023-06-09 HISTORY — DX: Presence of dental prosthetic device (complete) (partial): Z97.2

## 2023-06-09 HISTORY — DX: Anxiety disorder, unspecified: F41.9

## 2023-06-09 HISTORY — PX: SPACE OAR INSTILLATION: SHX6769

## 2023-06-09 HISTORY — DX: Personal history of urinary calculi: Z87.442

## 2023-06-09 HISTORY — DX: Prediabetes: R73.03

## 2023-06-09 HISTORY — DX: Transient cerebral ischemic attack, unspecified: G45.9

## 2023-06-09 LAB — POCT I-STAT, CHEM 8
BUN: 9 mg/dL (ref 8–23)
Calcium, Ion: 1.12 mmol/L — ABNORMAL LOW (ref 1.15–1.40)
Chloride: 104 mmol/L (ref 98–111)
Creatinine, Ser: 0.9 mg/dL (ref 0.61–1.24)
Glucose, Bld: 105 mg/dL — ABNORMAL HIGH (ref 70–99)
HCT: 48 % (ref 39.0–52.0)
Hemoglobin: 16.3 g/dL (ref 13.0–17.0)
Potassium: 3.6 mmol/L (ref 3.5–5.1)
Sodium: 140 mmol/L (ref 135–145)
TCO2: 25 mmol/L (ref 22–32)

## 2023-06-09 SURGERY — INSERTION, GOLD SEEDS
Anesthesia: Monitor Anesthesia Care | Site: Prostate

## 2023-06-09 MED ORDER — SODIUM CHLORIDE (PF) 0.9 % IJ SOLN
INTRAMUSCULAR | Status: DC | PRN
  Administered 2023-06-09: 10 mL via INTRAVENOUS

## 2023-06-09 MED ORDER — FENTANYL CITRATE (PF) 100 MCG/2ML IJ SOLN
25.0000 ug | INTRAMUSCULAR | Status: DC | PRN
  Administered 2023-06-09: 12.5 ug via INTRAVENOUS

## 2023-06-09 MED ORDER — PROPOFOL 10 MG/ML IV BOLUS
INTRAVENOUS | Status: DC | PRN
  Administered 2023-06-09 (×2): 10 mg via INTRAVENOUS

## 2023-06-09 MED ORDER — ACETAMINOPHEN 10 MG/ML IV SOLN
1000.0000 mg | Freq: Once | INTRAVENOUS | Status: DC | PRN
Start: 2023-06-09 — End: 2023-06-09

## 2023-06-09 MED ORDER — DEXMEDETOMIDINE HCL IN NACL 80 MCG/20ML IV SOLN
INTRAVENOUS | Status: DC | PRN
  Administered 2023-06-09: 4 ug via INTRAVENOUS

## 2023-06-09 MED ORDER — PROPOFOL 500 MG/50ML IV EMUL
INTRAVENOUS | Status: DC | PRN
  Administered 2023-06-09: 150 ug/kg/min via INTRAVENOUS

## 2023-06-09 MED ORDER — ONDANSETRON HCL 4 MG/2ML IJ SOLN: 4.0000 mg | Freq: Once | INTRAMUSCULAR | Status: DC | PRN

## 2023-06-09 MED ORDER — PHENYLEPHRINE HCL (PRESSORS) 10 MG/ML IV SOLN
INTRAVENOUS | Status: DC | PRN
  Administered 2023-06-09 (×5): 80 ug via INTRAVENOUS

## 2023-06-09 MED ORDER — ONDANSETRON HCL 4 MG/2ML IJ SOLN
INTRAMUSCULAR | Status: DC | PRN
  Administered 2023-06-09: 4 mg via INTRAVENOUS

## 2023-06-09 MED ORDER — CEFAZOLIN SODIUM-DEXTROSE 2-4 GM/100ML-% IV SOLN
INTRAVENOUS | Status: AC
  Filled 2023-06-09: qty 100

## 2023-06-09 MED ORDER — FLEET ENEMA RE ENEM: 1.0000 | ENEMA | Freq: Once | RECTAL | Status: DC

## 2023-06-09 MED ORDER — FENTANYL CITRATE (PF) 100 MCG/2ML IJ SOLN
INTRAMUSCULAR | Status: AC
  Filled 2023-06-09: qty 2

## 2023-06-09 MED ORDER — LIDOCAINE HCL 1 % IJ SOLN
INTRAMUSCULAR | Status: DC | PRN
  Administered 2023-06-09: 10 mL

## 2023-06-09 MED ORDER — CEFAZOLIN SODIUM-DEXTROSE 2-4 GM/100ML-% IV SOLN
2.0000 g | INTRAVENOUS | Status: AC
  Administered 2023-06-09: 2 g via INTRAVENOUS

## 2023-06-09 MED ORDER — FENTANYL CITRATE (PF) 100 MCG/2ML IJ SOLN
INTRAMUSCULAR | Status: DC | PRN
  Administered 2023-06-09 (×2): 25 ug via INTRAVENOUS

## 2023-06-09 MED ORDER — LIDOCAINE HCL (CARDIAC) PF 100 MG/5ML IV SOSY
PREFILLED_SYRINGE | INTRAVENOUS | Status: DC | PRN
  Administered 2023-06-09: 50 mg via INTRAVENOUS

## 2023-06-09 MED ORDER — SODIUM CHLORIDE 0.9 % IV SOLN
INTRAVENOUS | Status: DC
  Administered 2023-06-09: 1000 mL via INTRAVENOUS

## 2023-06-09 MED ORDER — LACTATED RINGERS IV SOLN: INTRAVENOUS | Status: DC

## 2023-06-09 SURGICAL SUPPLY — 23 items
BLADE CLIPPER SENSICLIP SURGIC (BLADE) ×1 IMPLANT
CNTNR URN SCR LID CUP LEK RST (MISCELLANEOUS) ×1 IMPLANT
COVER BACK TABLE 60X90IN (DRAPES) ×1 IMPLANT
DRSG TEGADERM 4X4.75 (GAUZE/BANDAGES/DRESSINGS) ×1 IMPLANT
DRSG TEGADERM 8X12 (GAUZE/BANDAGES/DRESSINGS) ×1 IMPLANT
GAUZE SPONGE 4X4 12PLY STRL (GAUZE/BANDAGES/DRESSINGS) ×1 IMPLANT
GAUZE SPONGE 4X4 12PLY STRL LF (GAUZE/BANDAGES/DRESSINGS) IMPLANT
GLOVE BIO SURGEON STRL SZ7.5 (GLOVE) ×1 IMPLANT
IMPL SPACEOAR VUE SYSTEM (Spacer) ×1 IMPLANT
IMPLANT SPACEOAR VUE SYSTEM (Spacer) ×1 IMPLANT
KIT TURNOVER CYSTO (KITS) ×1 IMPLANT
MARKER GOLD PRELOAD 1.2X3 (Urological Implant) ×1 IMPLANT
MARKER SKIN DUAL TIP RULER LAB (MISCELLANEOUS) ×1 IMPLANT
NDL SPNL 22GX3.5 QUINCKE BK (NEEDLE) ×1 IMPLANT
NEEDLE SPNL 22GX3.5 QUINCKE BK (NEEDLE) ×1
SEED GOLD PRELOAD 1.2X3 (Urological Implant) ×1 IMPLANT
SHEATH ULTRASOUND LF (SHEATH) IMPLANT
SLEEVE SCD COMPRESS KNEE MED (STOCKING) ×1 IMPLANT
SURGILUBE 2OZ TUBE FLIPTOP (MISCELLANEOUS) ×1 IMPLANT
SYR 10ML LL (SYRINGE) ×1 IMPLANT
SYR CONTROL 10ML LL (SYRINGE) ×1 IMPLANT
TOWEL OR 17X24 6PK STRL BLUE (TOWEL DISPOSABLE) ×1 IMPLANT
UNDERPAD 30X36 HEAVY ABSORB (UNDERPADS AND DIAPERS) ×1 IMPLANT

## 2023-06-09 NOTE — Anesthesia Postprocedure Evaluation (Signed)
 Anesthesia Post Note  [name]  Procedure(s) Performed: GOLD SEED IMPLANT (Prostate) SPACE OAR INSTILLATION (Prostate)     Patient location during evaluation: PACU Anesthesia Type: MAC Level of consciousness: awake and alert Pain management: pain level controlled Vital Signs Assessment: post-procedure vital signs reviewed and stable Respiratory status: spontaneous breathing, nonlabored ventilation, respiratory function stable and patient connected to nasal cannula oxygen Cardiovascular status: stable and blood pressure returned to baseline Postop Assessment: no apparent nausea or vomiting Anesthetic complications: no   No notable events documented.  Last Vitals:  Vitals:   06/09/23 1215 06/09/23 1233  BP:  126/86  Pulse:  62  Resp:  16  Temp: 36.7 C (!) 36.3 C  SpO2:  97%    Last Pain:  Vitals:   06/09/23 1233  TempSrc:   PainSc: 2                  Garnette DELENA Gab

## 2023-06-09 NOTE — H&P (Signed)
 Prostate cancer   Connor Stevens is a 73 year old male with a history of grade 2 prostate cancer.   Biopsy date: 01/15/23  PSA at diagnosis: 17.2  Prostate volume: 45 cm, no median lobe.  MRI: PI RADS 3 lesion, 1.2 cm, in Lt posterior medial peripheral zone. 3/3 cores Gleason 3+4 = 7, no evidence of extracapsular extension or pelvic metastasis   Patient is here for further discussion of his prostate cancer. He has met with Dr. Patrcia, and discussed pelvic radiation. It was recommended that he do external beam radiation if he so chose radiation because he was not a great candidate for brachytherapy given his voiding symptoms.   The patient states that he has significant urinary frequency. He voids a lot, he has a reasonable stream, does have some urgency. He has a sense of incomplete bladder emptying. He takes tamsulosin 0.8 mg daily.     ALLERGIES: Egg Yolk Latex    MEDICATIONS: Omeprazole  40 mg capsule,delayed release  Tamsulosin Hcl 0.4 mg capsule  Valium  10 mg tablet 1 tablet PO As Directed Take one hour prior to your scheduled procedure.  Acidophilus Lactobacilli  Amlodipine Besylate 10 mg tablet 1 tablet PO Daily  Atorvastatin Calcium 20 mg tablet 1 tablet PO Q HS  Duloxetine Hcl 60 mg capsule,delayed release  Guaifenesin 400 mg tablet  Lidocaine  5 % adhesive patch, medicated  Sildenafil Citrate 100 mg tablet  Trazodone Hcl 100 mg tablet  Vitamin D3 50 mcg (2,000 unit) tablet     GU PSH: Prostate Needle Biopsy - 01/15/2023     NON-GU PSH: Rotator cuff surgery, Right - 2010 Surgical Pathology, Gross And Microscopic Examination For Prostate Needle - 01/15/2023 Visit Complexity (formerly GPC1X) - 01/28/2023     GU PMH: BPH w/LUTS - 01/28/2023, - 11/30/2022, - 04/29/2022 Prostate Cancer - 01/28/2023 Weak Urinary Stream - 01/28/2023, - 11/30/2022, - 04/29/2022 Elevated PSA - 01/15/2023, - 11/30/2022, Patient with suspiciously elevating PSA trend. Normal feeling prostate., -  04/29/2022 Nocturia - 04/29/2022      PMH Notes:  1898-06-01 00:00:00 - Note: Normal Routine History And Physical Adult   NON-GU PMH: Alcohol dependence, uncomplicated Cervicalgia Chronic rhinitis COPD Generalized anxiety disorder Hypertension Illness, unspecified Insomnia, unspecified Major depressive disorder, recurrent, unspecified Nicotine dependence, unspecified, uncomplicated Other hyperlipidemia Pain in unspecified shoulder Polyp of colon Prediabetes Sleep Apnea    FAMILY HISTORY: No Family History    SOCIAL HISTORY: Marital Status: Single Current Smoking Status: Patient smokes.   Tobacco Use Assessment Completed: Used Tobacco in last 30 days?    REVIEW OF SYSTEMS:    GU Review Male:   Patient denies stream starts and stops, burning/ pain with urination, trouble starting your stream, frequent urination, get up at night to urinate, hard to postpone urination, have to strain to urinate , erection problems, penile pain, and leakage of urine.  Gastrointestinal (Upper):   Patient denies nausea, vomiting, and indigestion/ heartburn.  Gastrointestinal (Lower):   Patient denies diarrhea and constipation.  Constitutional:   Patient denies fever, night sweats, weight loss, and fatigue.  Skin:   Patient denies skin rash/ lesion and itching.  Eyes:   Patient denies blurred vision and double vision.  Ears/ Nose/ Throat:   Patient denies sore throat and sinus problems.  Hematologic/Lymphatic:   Patient denies swollen glands and easy bruising.  Cardiovascular:   Patient denies leg swelling and chest pains.  Respiratory:   Patient denies cough and shortness of breath.  Endocrine:   Patient denies excessive thirst.  Musculoskeletal:   Patient denies back pain and joint pain.  Neurological:   Patient denies headaches and dizziness.  Psychologic:   Patient denies depression and anxiety.   VITAL SIGNS: None   Complexity of Data:  Source Of History:  Patient  Lab Test Review:    PSA  Records Review:   Pathology Reports, Previous Doctor Records, Previous Patient Records, POC Tool  Urine Test Review:   Urinalysis   11/30/22 01/29/06 01/28/05 10/05/03  PSA  Total PSA 17.20 ng/mL 3.30  3.29  2.20     PROCEDURES:          Visit Complexity - G2211    ASSESSMENT:      ICD-10 Details  1 GU:   Prostate Cancer - C61      PLAN:           Document Letter(s):  Created for Chart: School Excuse   Created for Patient: Clinical Summary         Notes:   The patient and I discussed his management options again. I do not think he is a great surgical candidate, and I think external beam radiation would be his best option. I explained the process to him of fiducial marker and SpaceOAR instillation. We also discussed surgery briefly, but after going through that he was less interested. The patient has opted to proceed with external beam radiation. He will let Dr. Patrcia know. I will try to his markers and SpaceOAR procedure scheduled for him.

## 2023-06-09 NOTE — Transfer of Care (Signed)
 Immediate Anesthesia Transfer of Care Note  [name]  Procedure(s) Performed: GOLD SEED IMPLANT (Prostate) SPACE OAR INSTILLATION (Prostate)  Patient Location: PACU  Anesthesia Type:MAC  Level of Consciousness: awake, alert , oriented, and patient cooperative  Airway & Oxygen Therapy: Patient Spontanous Breathing and Patient connected to face mask oxygen  Post-op Assessment: Report given to RN and Post -op Vital signs reviewed and stable  Post vital signs: Reviewed and stable  Last Vitals:  Vitals Value Taken Time  BP    Temp    Pulse    Resp    SpO2      Last Pain:  Vitals:   06/09/23 0908  TempSrc: Oral  PainSc: 0-No pain      Patients Stated Pain Goal: 7 (06/09/23 0908)  Complications: No notable events documented.

## 2023-06-09 NOTE — Interval H&P Note (Signed)
 History and Physical Interval Note: PE Vitals:   06/08/23 1429 06/09/23 0908  BP:  127/82  Pulse:  75  Resp:  16  Temp:  98.5 F (36.9 C)  TempSrc:  Oral  SpO2:  99%  Weight: 74.8 kg 73.1 kg  Height:  5' 7 (1.702 m)   NAD RRR CTA-B Abdomen soft  06/09/2023 10:53 AM  Connor Stevens  has presented today for surgery, with the diagnosis of PROSTATE CANCER.  The various methods of treatment have been discussed with the patient and family. After consideration of risks, benefits and other options for treatment, the patient has consented to  Procedure(s) with comments: GOLD SEED IMPLANT (N/A) - 30 MINUTES SPACE OAR INSTILLATION (N/A) as a surgical intervention.  The patient's history has been reviewed, patient examined, no change in status, stable for surgery.  I have reviewed the patient's chart and labs.  Questions were answered to the patient's satisfaction.     Morene LELON Salines

## 2023-06-09 NOTE — Discharge Instructions (Signed)
 For several days the patient:  should increase his fluid intake and limit strenuous activity. he might have mild discomfort at the base of his penis or in his rectum. he might have blood in his urine or blood in his bowel movements.  For 2-3 months he might have blood in his ejaculate (semen).  Call the office immedicately:  for blood clots in the urine or bowel movements,  difficulty urinating,  inability to urinate,  urinary retention,  painful or frequent urination,  fever, chills,  nausea, vomiting, other illness.    Contact [facility] to check on the status of his biopsy if he has not heard from us  within 7 days.  [facility]:  [phone]

## 2023-06-09 NOTE — Op Note (Signed)
 Preoperative diagnosis: Clinically localized adenocarcinoma of the prostate   Postoperative diagnosis: Clinically localized adenocarcinoma of the prostate  Procedure: 1) Placement of fiducial markers into prostate                    2) Insertion of SpaceOAR hydrogel   Surgeon: Morene Salines, M.D.  Anesthesia: General  EBL: Minimal  Complications: None  Indication: Connor Stevens is a 73 y.o. gentleman with clinically localized prostate cancer. After discussing management options for treatment, he elected to proceed with radiotherapy. He presents today for the above procedures. The potential risks, complications, alternative options, and expected recovery course have been discussed in detail with the patient and he has provided informed consent to proceed.  Description of procedure: The patient was administered preoperative antibiotics, placed in the dorsal lithotomy position, and prepped and draped in the usual sterile fashion. Next, transrectal ultrasonography was utilized to visualize the prostate. Three gold fiducial markers were then placed into the prostate via transperineal needles under ultrasound guidance at the right apex, right base, and left mid gland under direct ultrasound guidance. A site in the midline was then selected on the perineum for placement of an 18 g needle with saline. The needle was advanced above the rectum and below Denonvillier's fascia to the mid gland and confirmed to be in the midline on transverse imaging. One cc of saline was injected confirming appropriate expansion of this space. A total of 5 cc of saline was then injected to open the space further bilaterally. The saline syringe was then removed and the SpaceOAR hydrogel was injected with good distribution bilaterally. He tolerated the procedure well and without complications. He was given a voiding trial prior to discharge from the PACU.

## 2023-06-09 NOTE — Op Note (Deleted)
 Prostate cancer   Connor Stevens is a 73 year old male with a history of grade 2 prostate cancer.   Biopsy date: 01/15/23  PSA at diagnosis: 17.2  Prostate volume: 45 cm, no median lobe.  MRI: PI RADS 3 lesion, 1.2 cm, in Lt posterior medial peripheral zone. 3/3 cores Gleason 3+4 = 7, no evidence of extracapsular extension or pelvic metastasis   Patient is here for further discussion of his prostate cancer. He has met with Dr. Patrcia, and discussed pelvic radiation. It was recommended that he do external beam radiation if he so chose radiation because he was not a great candidate for brachytherapy given his voiding symptoms.   The patient states that he has significant urinary frequency. He voids a lot, he has a reasonable stream, does have some urgency. He has a sense of incomplete bladder emptying. He takes tamsulosin 0.8 mg daily.     ALLERGIES: Egg Yolk Latex    MEDICATIONS: Omeprazole  40 mg capsule,delayed release  Tamsulosin Hcl 0.4 mg capsule  Valium  10 mg tablet 1 tablet PO As Directed Take one hour prior to your scheduled procedure.  Acidophilus Lactobacilli  Amlodipine Besylate 10 mg tablet 1 tablet PO Daily  Atorvastatin Calcium 20 mg tablet 1 tablet PO Q HS  Duloxetine Hcl 60 mg capsule,delayed release  Guaifenesin 400 mg tablet  Lidocaine  5 % adhesive patch, medicated  Sildenafil Citrate 100 mg tablet  Trazodone Hcl 100 mg tablet  Vitamin D3 50 mcg (2,000 unit) tablet     GU PSH: Prostate Needle Biopsy - 01/15/2023     NON-GU PSH: Rotator cuff surgery, Right - 2010 Surgical Pathology, Gross And Microscopic Examination For Prostate Needle - 01/15/2023 Visit Complexity (formerly GPC1X) - 01/28/2023     GU PMH: BPH w/LUTS - 01/28/2023, - 11/30/2022, - 04/29/2022 Prostate Cancer - 01/28/2023 Weak Urinary Stream - 01/28/2023, - 11/30/2022, - 04/29/2022 Elevated PSA - 01/15/2023, - 11/30/2022, Patient with suspiciously elevating PSA trend. Normal feeling prostate., -  04/29/2022 Nocturia - 04/29/2022      PMH Notes:  1898-06-01 00:00:00 - Note: Normal Routine History And Physical Adult   NON-GU PMH: Alcohol dependence, uncomplicated Cervicalgia Chronic rhinitis COPD Generalized anxiety disorder Hypertension Illness, unspecified Insomnia, unspecified Major depressive disorder, recurrent, unspecified Nicotine dependence, unspecified, uncomplicated Other hyperlipidemia Pain in unspecified shoulder Polyp of colon Prediabetes Sleep Apnea    FAMILY HISTORY: No Family History    SOCIAL HISTORY: Marital Status: Single Current Smoking Status: Patient smokes.   Tobacco Use Assessment Completed: Used Tobacco in last 30 days?    REVIEW OF SYSTEMS:    GU Review Male:   Patient denies stream starts and stops, burning/ pain with urination, trouble starting your stream, frequent urination, get up at night to urinate, hard to postpone urination, have to strain to urinate , erection problems, penile pain, and leakage of urine.  Gastrointestinal (Upper):   Patient denies nausea, vomiting, and indigestion/ heartburn.  Gastrointestinal (Lower):   Patient denies diarrhea and constipation.  Constitutional:   Patient denies fever, night sweats, weight loss, and fatigue.  Skin:   Patient denies skin rash/ lesion and itching.  Eyes:   Patient denies blurred vision and double vision.  Ears/ Nose/ Throat:   Patient denies sore throat and sinus problems.  Hematologic/Lymphatic:   Patient denies swollen glands and easy bruising.  Cardiovascular:   Patient denies leg swelling and chest pains.  Respiratory:   Patient denies cough and shortness of breath.  Endocrine:   Patient denies excessive thirst.  Musculoskeletal:   Patient denies back pain and joint pain.  Neurological:   Patient denies headaches and dizziness.  Psychologic:   Patient denies depression and anxiety.   VITAL SIGNS: None   Complexity of Data:  Source Of History:  Patient  Lab Test Review:    PSA  Records Review:   Pathology Reports, Previous Doctor Records, Previous Patient Records, POC Tool  Urine Test Review:   Urinalysis   11/30/22 01/29/06 01/28/05 10/05/03  PSA  Total PSA 17.20 ng/mL 3.30  3.29  2.20     PROCEDURES:          Visit Complexity - G2211    ASSESSMENT:      ICD-10 Details  1 GU:   Prostate Cancer - C61      PLAN:           Document Letter(s):  Created for Chart: School Excuse   Created for Patient: Clinical Summary         Notes:   The patient and I discussed his management options again. I do not think he is a great surgical candidate, and I think external beam radiation would be his best option. I explained the process to him of fiducial marker and SpaceOAR instillation. We also discussed surgery briefly, but after going through that he was less interested. The patient has opted to proceed with external beam radiation. He will let Dr. Patrcia know. I will try to his markers and SpaceOAR procedure scheduled for him.

## 2023-06-10 ENCOUNTER — Telehealth: Payer: Self-pay | Admitting: *Deleted

## 2023-06-10 NOTE — Telephone Encounter (Signed)
 CALLED PATIENT TO REMIND OF SIM APPT. FOR 06-11-23 - ARRIVAL TIME- 12:45 PM @ [facility], INFORMED PATIENT TO ARRIVE WITH A FULL BLADDER

## 2023-06-10 NOTE — Progress Notes (Signed)
  Radiation Oncology         [phone] ________________________________  Name: Connor Stevens MRN: [id number]  Date: 06/11/2023  DOB: [birth year]  SIMULATION AND TREATMENT PLANNING NOTE    ICD-10-CM   1. Malignant neoplasm of prostate (HCC)  C61       DIAGNOSIS:  73 y.o. gentleman with Stage T1c adenocarcinoma of the prostate with Gleason score of 3+4, and PSA of 17.2.   NARRATIVE:  The patient was brought to the CT Simulation planning suite.  Identity was confirmed.  All relevant records and images related to the planned course of therapy were reviewed.  The patient freely provided informed written consent to proceed with treatment after reviewing the details related to the planned course of therapy. The consent form was witnessed and verified by the simulation staff.  Then, the patient was set-up in a stable reproducible supine position for radiation therapy.  A vacuum lock pillow device was custom fabricated to position his legs in a reproducible immobilized position.  Then, I performed a urethrogram under sterile conditions to identify the prostatic apex.  CT images were obtained.  Surface markings were placed.  The CT images were loaded into the planning software.  Then the prostate target and avoidance structures including the rectum, bladder, bowel and hips were contoured.  Treatment planning then occurred.  The radiation prescription was entered and confirmed.  A total of one complex treatment devices was fabricated. I have requested : Intensity Modulated Radiotherapy (IMRT) is medically necessary for this case for the following reason:  Rectal sparing.SABRA  PLAN:  The patient will receive 70 Gy in 28 fractions.  ________________________________  Donnice FELIX Patrcia, M.D.

## 2023-06-10 NOTE — Progress Notes (Signed)
 Patient aware of PSMA PET results and recommendations remains for 5.5 weeks of daily radiation.   No additional needs at this time.

## 2023-06-11 ENCOUNTER — Ambulatory Visit
Admission: RE | Admit: 2023-06-11 | Discharge: 2023-06-11 | Disposition: A | Discharge status: Home / Self Care | Payer: No Typology Code available for payment source | Source: Ambulatory Visit | Attending: Radiation Oncology | Admitting: Radiation Oncology

## 2023-06-11 ENCOUNTER — Encounter (HOSPITAL_BASED_OUTPATIENT_CLINIC_OR_DEPARTMENT_OTHER): Payer: Self-pay | Admitting: Urology

## 2023-06-11 DIAGNOSIS — C61 Malignant neoplasm of prostate: Secondary | ICD-10-CM | POA: Diagnosis present

## 2023-06-11 DIAGNOSIS — Z510 Encounter for antineoplastic radiation therapy: Secondary | ICD-10-CM | POA: Diagnosis present

## 2023-06-18 DIAGNOSIS — Z510 Encounter for antineoplastic radiation therapy: Secondary | ICD-10-CM | POA: Diagnosis not present

## 2023-06-28 ENCOUNTER — Other Ambulatory Visit: Payer: Self-pay

## 2023-06-28 ENCOUNTER — Ambulatory Visit
Admission: RE | Admit: 2023-06-28 | Discharge: 2023-06-28 | Disposition: A | Discharge status: Home / Self Care | Payer: No Typology Code available for payment source | Source: Ambulatory Visit | Attending: Radiation Oncology

## 2023-06-28 DIAGNOSIS — Z510 Encounter for antineoplastic radiation therapy: Secondary | ICD-10-CM | POA: Diagnosis not present

## 2023-06-28 LAB — RAD ONC ARIA SESSION SUMMARY
Course Elapsed Days: 0
Plan Fractions Treated to Date: 1
Plan Prescribed Dose Per Fraction: 2.5 Gy
Plan Total Fractions Prescribed: 28
Plan Total Prescribed Dose: 70 Gy
Reference Point Dosage Given to Date: 2.5 Gy
Reference Point Session Dosage Given: 2.5 Gy
Session Number: 1

## 2023-06-29 ENCOUNTER — Other Ambulatory Visit: Payer: Self-pay

## 2023-06-29 ENCOUNTER — Ambulatory Visit
Admission: RE | Admit: 2023-06-29 | Discharge: 2023-06-29 | Disposition: A | Discharge status: Home / Self Care | Payer: No Typology Code available for payment source | Source: Ambulatory Visit | Attending: Radiation Oncology

## 2023-06-29 DIAGNOSIS — Z510 Encounter for antineoplastic radiation therapy: Secondary | ICD-10-CM | POA: Diagnosis not present

## 2023-06-29 LAB — RAD ONC ARIA SESSION SUMMARY
Course Elapsed Days: 1
Plan Fractions Treated to Date: 2
Plan Prescribed Dose Per Fraction: 2.5 Gy
Plan Total Fractions Prescribed: 28
Plan Total Prescribed Dose: 70 Gy
Reference Point Dosage Given to Date: 5 Gy
Reference Point Session Dosage Given: 2.5 Gy
Session Number: 2

## 2023-06-30 ENCOUNTER — Ambulatory Visit
Admission: RE | Admit: 2023-06-30 | Discharge: 2023-06-30 | Disposition: A | Discharge status: Home / Self Care | Payer: No Typology Code available for payment source | Source: Ambulatory Visit | Attending: Radiation Oncology | Admitting: Radiation Oncology

## 2023-06-30 ENCOUNTER — Other Ambulatory Visit: Payer: Self-pay

## 2023-06-30 DIAGNOSIS — Z510 Encounter for antineoplastic radiation therapy: Secondary | ICD-10-CM | POA: Diagnosis not present

## 2023-06-30 LAB — RAD ONC ARIA SESSION SUMMARY
Course Elapsed Days: 2
Plan Fractions Treated to Date: 3
Plan Prescribed Dose Per Fraction: 2.5 Gy
Plan Total Fractions Prescribed: 28
Plan Total Prescribed Dose: 70 Gy
Reference Point Dosage Given to Date: 7.5 Gy
Reference Point Session Dosage Given: 2.5 Gy
Session Number: 3

## 2023-07-01 ENCOUNTER — Other Ambulatory Visit: Payer: Self-pay

## 2023-07-01 ENCOUNTER — Ambulatory Visit
Admission: RE | Admit: 2023-07-01 | Discharge: 2023-07-01 | Disposition: A | Discharge status: Home / Self Care | Payer: No Typology Code available for payment source | Source: Ambulatory Visit | Attending: Radiation Oncology | Admitting: Radiation Oncology

## 2023-07-01 DIAGNOSIS — Z510 Encounter for antineoplastic radiation therapy: Secondary | ICD-10-CM | POA: Diagnosis not present

## 2023-07-01 LAB — RAD ONC ARIA SESSION SUMMARY
Course Elapsed Days: 3
Plan Fractions Treated to Date: 4
Plan Prescribed Dose Per Fraction: 2.5 Gy
Plan Total Fractions Prescribed: 28
Plan Total Prescribed Dose: 70 Gy
Reference Point Dosage Given to Date: 10 Gy
Reference Point Session Dosage Given: 2.5 Gy
Session Number: 4

## 2023-07-02 ENCOUNTER — Ambulatory Visit: Payer: No Typology Code available for payment source

## 2023-07-05 ENCOUNTER — Other Ambulatory Visit: Payer: Self-pay

## 2023-07-05 ENCOUNTER — Ambulatory Visit
Admission: RE | Admit: 2023-07-05 | Discharge: 2023-07-05 | Disposition: A | Discharge status: Home / Self Care | Payer: No Typology Code available for payment source | Source: Ambulatory Visit | Attending: Radiation Oncology | Admitting: Radiation Oncology

## 2023-07-05 DIAGNOSIS — C61 Malignant neoplasm of prostate: Secondary | ICD-10-CM | POA: Diagnosis present

## 2023-07-05 DIAGNOSIS — Z510 Encounter for antineoplastic radiation therapy: Secondary | ICD-10-CM | POA: Insufficient documentation

## 2023-07-05 LAB — RAD ONC ARIA SESSION SUMMARY
Course Elapsed Days: 7
Plan Fractions Treated to Date: 5
Plan Prescribed Dose Per Fraction: 2.5 Gy
Plan Total Fractions Prescribed: 28
Plan Total Prescribed Dose: 70 Gy
Reference Point Dosage Given to Date: 12.5 Gy
Reference Point Session Dosage Given: 2.5 Gy
Session Number: 5

## 2023-07-06 ENCOUNTER — Ambulatory Visit
Admission: RE | Admit: 2023-07-06 | Discharge: 2023-07-06 | Disposition: A | Discharge status: Home / Self Care | Payer: No Typology Code available for payment source | Source: Ambulatory Visit | Attending: Radiation Oncology

## 2023-07-06 ENCOUNTER — Other Ambulatory Visit: Payer: Self-pay

## 2023-07-06 DIAGNOSIS — Z510 Encounter for antineoplastic radiation therapy: Secondary | ICD-10-CM | POA: Diagnosis not present

## 2023-07-06 LAB — RAD ONC ARIA SESSION SUMMARY
Course Elapsed Days: 8
Plan Fractions Treated to Date: 6
Plan Prescribed Dose Per Fraction: 2.5 Gy
Plan Total Fractions Prescribed: 28
Plan Total Prescribed Dose: 70 Gy
Reference Point Dosage Given to Date: 15 Gy
Reference Point Session Dosage Given: 2.5 Gy
Session Number: 6

## 2023-07-07 ENCOUNTER — Other Ambulatory Visit: Payer: Self-pay

## 2023-07-07 ENCOUNTER — Ambulatory Visit
Admission: RE | Admit: 2023-07-07 | Discharge: 2023-07-07 | Disposition: A | Discharge status: Home / Self Care | Payer: No Typology Code available for payment source | Source: Ambulatory Visit | Attending: Radiation Oncology

## 2023-07-07 DIAGNOSIS — Z510 Encounter for antineoplastic radiation therapy: Secondary | ICD-10-CM | POA: Diagnosis not present

## 2023-07-07 LAB — RAD ONC ARIA SESSION SUMMARY
Course Elapsed Days: 9
Plan Fractions Treated to Date: 7
Plan Prescribed Dose Per Fraction: 2.5 Gy
Plan Total Fractions Prescribed: 28
Plan Total Prescribed Dose: 70 Gy
Reference Point Dosage Given to Date: 17.5 Gy
Reference Point Session Dosage Given: 2.5 Gy
Session Number: 7

## 2023-07-08 ENCOUNTER — Other Ambulatory Visit: Payer: Self-pay

## 2023-07-08 ENCOUNTER — Ambulatory Visit
Admission: RE | Admit: 2023-07-08 | Discharge: 2023-07-08 | Disposition: A | Discharge status: Home / Self Care | Payer: No Typology Code available for payment source | Source: Ambulatory Visit | Attending: Radiation Oncology | Admitting: Radiation Oncology

## 2023-07-08 DIAGNOSIS — Z510 Encounter for antineoplastic radiation therapy: Secondary | ICD-10-CM | POA: Diagnosis not present

## 2023-07-08 LAB — RAD ONC ARIA SESSION SUMMARY
Course Elapsed Days: 10
Plan Fractions Treated to Date: 8
Plan Prescribed Dose Per Fraction: 2.5 Gy
Plan Total Fractions Prescribed: 28
Plan Total Prescribed Dose: 70 Gy
Reference Point Dosage Given to Date: 20 Gy
Reference Point Session Dosage Given: 2.5 Gy
Session Number: 8

## 2023-07-09 ENCOUNTER — Ambulatory Visit
Admission: RE | Admit: 2023-07-09 | Discharge: 2023-07-09 | Disposition: A | Discharge status: Home / Self Care | Payer: No Typology Code available for payment source | Source: Ambulatory Visit | Attending: Radiation Oncology

## 2023-07-09 ENCOUNTER — Ambulatory Visit
Admission: RE | Admit: 2023-07-09 | Discharge: 2023-07-09 | Disposition: A | Discharge status: Home / Self Care | Payer: No Typology Code available for payment source | Source: Ambulatory Visit | Attending: Radiation Oncology | Admitting: Radiation Oncology

## 2023-07-09 ENCOUNTER — Other Ambulatory Visit: Payer: Self-pay

## 2023-07-09 DIAGNOSIS — Z510 Encounter for antineoplastic radiation therapy: Secondary | ICD-10-CM | POA: Diagnosis not present

## 2023-07-09 LAB — RAD ONC ARIA SESSION SUMMARY
Course Elapsed Days: 11
Plan Fractions Treated to Date: 9
Plan Prescribed Dose Per Fraction: 2.5 Gy
Plan Total Fractions Prescribed: 28
Plan Total Prescribed Dose: 70 Gy
Reference Point Dosage Given to Date: 22.5 Gy
Reference Point Session Dosage Given: 2.5 Gy
Session Number: 9

## 2023-07-12 ENCOUNTER — Other Ambulatory Visit: Payer: Self-pay

## 2023-07-12 ENCOUNTER — Ambulatory Visit
Admission: RE | Admit: 2023-07-12 | Discharge: 2023-07-12 | Disposition: A | Discharge status: Home / Self Care | Payer: No Typology Code available for payment source | Source: Ambulatory Visit | Attending: Radiation Oncology | Admitting: Radiation Oncology

## 2023-07-12 DIAGNOSIS — Z510 Encounter for antineoplastic radiation therapy: Secondary | ICD-10-CM | POA: Diagnosis not present

## 2023-07-12 LAB — RAD ONC ARIA SESSION SUMMARY
Course Elapsed Days: 14
Plan Fractions Treated to Date: 10
Plan Prescribed Dose Per Fraction: 2.5 Gy
Plan Total Fractions Prescribed: 28
Plan Total Prescribed Dose: 70 Gy
Reference Point Dosage Given to Date: 25 Gy
Reference Point Session Dosage Given: 2.5 Gy
Session Number: 10

## 2023-07-13 ENCOUNTER — Ambulatory Visit
Admission: RE | Admit: 2023-07-13 | Discharge: 2023-07-13 | Disposition: A | Discharge status: Home / Self Care | Payer: No Typology Code available for payment source | Source: Ambulatory Visit | Attending: Radiation Oncology

## 2023-07-13 ENCOUNTER — Other Ambulatory Visit: Payer: Self-pay

## 2023-07-13 DIAGNOSIS — Z510 Encounter for antineoplastic radiation therapy: Secondary | ICD-10-CM | POA: Diagnosis not present

## 2023-07-13 LAB — RAD ONC ARIA SESSION SUMMARY
Course Elapsed Days: 15
Plan Fractions Treated to Date: 11
Plan Prescribed Dose Per Fraction: 2.5 Gy
Plan Total Fractions Prescribed: 28
Plan Total Prescribed Dose: 70 Gy
Reference Point Dosage Given to Date: 27.5 Gy
Reference Point Session Dosage Given: 2.5 Gy
Session Number: 11

## 2023-07-14 ENCOUNTER — Other Ambulatory Visit: Payer: Self-pay

## 2023-07-14 ENCOUNTER — Ambulatory Visit
Admission: RE | Admit: 2023-07-14 | Discharge: 2023-07-14 | Disposition: A | Discharge status: Home / Self Care | Payer: No Typology Code available for payment source | Source: Ambulatory Visit | Attending: Radiation Oncology | Admitting: Radiation Oncology

## 2023-07-14 DIAGNOSIS — Z510 Encounter for antineoplastic radiation therapy: Secondary | ICD-10-CM | POA: Diagnosis not present

## 2023-07-14 LAB — RAD ONC ARIA SESSION SUMMARY
Course Elapsed Days: 16
Plan Fractions Treated to Date: 12
Plan Prescribed Dose Per Fraction: 2.5 Gy
Plan Total Fractions Prescribed: 28
Plan Total Prescribed Dose: 70 Gy
Reference Point Dosage Given to Date: 30 Gy
Reference Point Session Dosage Given: 2.5 Gy
Session Number: 12

## 2023-07-15 ENCOUNTER — Other Ambulatory Visit: Payer: Self-pay

## 2023-07-15 ENCOUNTER — Ambulatory Visit
Admission: RE | Admit: 2023-07-15 | Discharge: 2023-07-15 | Disposition: A | Discharge status: Home / Self Care | Payer: No Typology Code available for payment source | Source: Ambulatory Visit | Attending: Radiation Oncology | Admitting: Radiation Oncology

## 2023-07-15 DIAGNOSIS — Z510 Encounter for antineoplastic radiation therapy: Secondary | ICD-10-CM | POA: Diagnosis not present

## 2023-07-15 LAB — RAD ONC ARIA SESSION SUMMARY
Course Elapsed Days: 17
Plan Fractions Treated to Date: 13
Plan Prescribed Dose Per Fraction: 2.5 Gy
Plan Total Fractions Prescribed: 28
Plan Total Prescribed Dose: 70 Gy
Reference Point Dosage Given to Date: 32.5 Gy
Reference Point Session Dosage Given: 2.5 Gy
Session Number: 13

## 2023-07-16 ENCOUNTER — Ambulatory Visit
Admission: RE | Admit: 2023-07-16 | Discharge: 2023-07-16 | Disposition: A | Discharge status: Home / Self Care | Payer: No Typology Code available for payment source | Source: Ambulatory Visit | Attending: Radiation Oncology | Admitting: Radiation Oncology

## 2023-07-16 ENCOUNTER — Other Ambulatory Visit: Payer: Self-pay

## 2023-07-16 DIAGNOSIS — Z510 Encounter for antineoplastic radiation therapy: Secondary | ICD-10-CM | POA: Diagnosis not present

## 2023-07-16 LAB — RAD ONC ARIA SESSION SUMMARY
Course Elapsed Days: 18
Plan Fractions Treated to Date: 14
Plan Prescribed Dose Per Fraction: 2.5 Gy
Plan Total Fractions Prescribed: 28
Plan Total Prescribed Dose: 70 Gy
Reference Point Dosage Given to Date: 35 Gy
Reference Point Session Dosage Given: 2.5 Gy
Session Number: 14

## 2023-07-19 ENCOUNTER — Other Ambulatory Visit: Payer: Self-pay

## 2023-07-19 ENCOUNTER — Ambulatory Visit
Admission: RE | Admit: 2023-07-19 | Discharge: 2023-07-19 | Disposition: A | Discharge status: Home / Self Care | Payer: No Typology Code available for payment source | Source: Ambulatory Visit | Attending: Radiation Oncology

## 2023-07-19 DIAGNOSIS — Z510 Encounter for antineoplastic radiation therapy: Secondary | ICD-10-CM | POA: Diagnosis not present

## 2023-07-19 LAB — RAD ONC ARIA SESSION SUMMARY
Course Elapsed Days: 21
Plan Fractions Treated to Date: 15
Plan Prescribed Dose Per Fraction: 2.5 Gy
Plan Total Fractions Prescribed: 28
Plan Total Prescribed Dose: 70 Gy
Reference Point Dosage Given to Date: 37.5 Gy
Reference Point Session Dosage Given: 2.5 Gy
Session Number: 15

## 2023-07-20 ENCOUNTER — Ambulatory Visit
Admission: RE | Admit: 2023-07-20 | Discharge: 2023-07-20 | Disposition: A | Discharge status: Home / Self Care | Payer: No Typology Code available for payment source | Source: Ambulatory Visit | Attending: Radiation Oncology | Admitting: Radiation Oncology

## 2023-07-20 ENCOUNTER — Other Ambulatory Visit: Payer: Self-pay

## 2023-07-20 DIAGNOSIS — Z510 Encounter for antineoplastic radiation therapy: Secondary | ICD-10-CM | POA: Diagnosis not present

## 2023-07-20 LAB — RAD ONC ARIA SESSION SUMMARY
Course Elapsed Days: 22
Plan Fractions Treated to Date: 16
Plan Prescribed Dose Per Fraction: 2.5 Gy
Plan Total Fractions Prescribed: 28
Plan Total Prescribed Dose: 70 Gy
Reference Point Dosage Given to Date: 40 Gy
Reference Point Session Dosage Given: 2.5 Gy
Session Number: 16

## 2023-07-21 ENCOUNTER — Other Ambulatory Visit: Payer: Self-pay

## 2023-07-21 ENCOUNTER — Ambulatory Visit
Admission: RE | Admit: 2023-07-21 | Discharge: 2023-07-21 | Disposition: A | Discharge status: Home / Self Care | Payer: No Typology Code available for payment source | Source: Ambulatory Visit | Attending: Radiation Oncology | Admitting: Radiation Oncology

## 2023-07-21 DIAGNOSIS — Z510 Encounter for antineoplastic radiation therapy: Secondary | ICD-10-CM | POA: Diagnosis not present

## 2023-07-21 LAB — RAD ONC ARIA SESSION SUMMARY
Course Elapsed Days: 23
Plan Fractions Treated to Date: 17
Plan Prescribed Dose Per Fraction: 2.5 Gy
Plan Total Fractions Prescribed: 28
Plan Total Prescribed Dose: 70 Gy
Reference Point Dosage Given to Date: 42.5 Gy
Reference Point Session Dosage Given: 2.5 Gy
Session Number: 17

## 2023-07-22 ENCOUNTER — Ambulatory Visit
Admission: RE | Admit: 2023-07-22 | Discharge: 2023-07-22 | Disposition: A | Discharge status: Home / Self Care | Payer: No Typology Code available for payment source | Source: Ambulatory Visit | Attending: Radiation Oncology | Admitting: Radiation Oncology

## 2023-07-22 ENCOUNTER — Other Ambulatory Visit: Payer: Self-pay

## 2023-07-22 DIAGNOSIS — Z510 Encounter for antineoplastic radiation therapy: Secondary | ICD-10-CM | POA: Diagnosis not present

## 2023-07-22 LAB — RAD ONC ARIA SESSION SUMMARY
Course Elapsed Days: 24
Plan Fractions Treated to Date: 18
Plan Prescribed Dose Per Fraction: 2.5 Gy
Plan Total Fractions Prescribed: 28
Plan Total Prescribed Dose: 70 Gy
Reference Point Dosage Given to Date: 45 Gy
Reference Point Session Dosage Given: 2.5 Gy
Session Number: 18

## 2023-07-23 ENCOUNTER — Other Ambulatory Visit: Payer: Self-pay

## 2023-07-23 ENCOUNTER — Ambulatory Visit
Admission: RE | Admit: 2023-07-23 | Discharge: 2023-07-23 | Disposition: A | Discharge status: Home / Self Care | Payer: No Typology Code available for payment source | Source: Ambulatory Visit | Attending: Radiation Oncology | Admitting: Radiation Oncology

## 2023-07-23 DIAGNOSIS — Z510 Encounter for antineoplastic radiation therapy: Secondary | ICD-10-CM | POA: Diagnosis not present

## 2023-07-23 LAB — RAD ONC ARIA SESSION SUMMARY
Course Elapsed Days: 25
Plan Fractions Treated to Date: 19
Plan Prescribed Dose Per Fraction: 2.5 Gy
Plan Total Fractions Prescribed: 28
Plan Total Prescribed Dose: 70 Gy
Reference Point Dosage Given to Date: 47.5 Gy
Reference Point Session Dosage Given: 2.5 Gy
Session Number: 19

## 2023-07-26 ENCOUNTER — Ambulatory Visit
Admission: RE | Admit: 2023-07-26 | Discharge: 2023-07-26 | Disposition: A | Discharge status: Home / Self Care | Payer: No Typology Code available for payment source | Source: Ambulatory Visit | Attending: Radiation Oncology | Admitting: Radiation Oncology

## 2023-07-26 ENCOUNTER — Other Ambulatory Visit: Payer: Self-pay

## 2023-07-26 DIAGNOSIS — Z510 Encounter for antineoplastic radiation therapy: Secondary | ICD-10-CM | POA: Diagnosis not present

## 2023-07-26 LAB — RAD ONC ARIA SESSION SUMMARY
Course Elapsed Days: 28
Plan Fractions Treated to Date: 20
Plan Prescribed Dose Per Fraction: 2.5 Gy
Plan Total Fractions Prescribed: 28
Plan Total Prescribed Dose: 70 Gy
Reference Point Dosage Given to Date: 50 Gy
Reference Point Session Dosage Given: 2.5 Gy
Session Number: 20

## 2023-07-27 ENCOUNTER — Other Ambulatory Visit: Payer: Self-pay

## 2023-07-27 ENCOUNTER — Ambulatory Visit
Admission: RE | Admit: 2023-07-27 | Discharge: 2023-07-27 | Disposition: A | Discharge status: Home / Self Care | Payer: No Typology Code available for payment source | Source: Ambulatory Visit | Attending: Radiation Oncology

## 2023-07-27 DIAGNOSIS — Z510 Encounter for antineoplastic radiation therapy: Secondary | ICD-10-CM | POA: Diagnosis not present

## 2023-07-27 LAB — RAD ONC ARIA SESSION SUMMARY
Course Elapsed Days: 29
Plan Fractions Treated to Date: 21
Plan Prescribed Dose Per Fraction: 2.5 Gy
Plan Total Fractions Prescribed: 28
Plan Total Prescribed Dose: 70 Gy
Reference Point Dosage Given to Date: 52.5 Gy
Reference Point Session Dosage Given: 2.5 Gy
Session Number: 21

## 2023-07-28 ENCOUNTER — Ambulatory Visit
Admission: RE | Admit: 2023-07-28 | Discharge: 2023-07-28 | Disposition: A | Discharge status: Home / Self Care | Payer: No Typology Code available for payment source | Source: Ambulatory Visit | Attending: Radiation Oncology | Admitting: Radiation Oncology

## 2023-07-28 ENCOUNTER — Other Ambulatory Visit: Payer: Self-pay

## 2023-07-28 DIAGNOSIS — Z510 Encounter for antineoplastic radiation therapy: Secondary | ICD-10-CM | POA: Diagnosis not present

## 2023-07-28 LAB — RAD ONC ARIA SESSION SUMMARY
Course Elapsed Days: 30
Plan Fractions Treated to Date: 22
Plan Prescribed Dose Per Fraction: 2.5 Gy
Plan Total Fractions Prescribed: 28
Plan Total Prescribed Dose: 70 Gy
Reference Point Dosage Given to Date: 55 Gy
Reference Point Session Dosage Given: 2.5 Gy
Session Number: 22

## 2023-07-29 ENCOUNTER — Other Ambulatory Visit: Payer: Self-pay

## 2023-07-29 ENCOUNTER — Ambulatory Visit
Admission: RE | Admit: 2023-07-29 | Discharge: 2023-07-29 | Disposition: A | Discharge status: Home / Self Care | Payer: No Typology Code available for payment source | Source: Ambulatory Visit | Attending: Radiation Oncology

## 2023-07-29 DIAGNOSIS — Z510 Encounter for antineoplastic radiation therapy: Secondary | ICD-10-CM | POA: Diagnosis not present

## 2023-07-29 LAB — RAD ONC ARIA SESSION SUMMARY
Course Elapsed Days: 31
Plan Fractions Treated to Date: 23
Plan Prescribed Dose Per Fraction: 2.5 Gy
Plan Total Fractions Prescribed: 28
Plan Total Prescribed Dose: 70 Gy
Reference Point Dosage Given to Date: 57.5 Gy
Reference Point Session Dosage Given: 2.5 Gy
Session Number: 23

## 2023-07-30 ENCOUNTER — Other Ambulatory Visit: Payer: Self-pay

## 2023-07-30 ENCOUNTER — Ambulatory Visit
Admission: RE | Admit: 2023-07-30 | Discharge: 2023-07-30 | Disposition: A | Discharge status: Home / Self Care | Payer: No Typology Code available for payment source | Source: Ambulatory Visit | Attending: Radiation Oncology | Admitting: Radiation Oncology

## 2023-07-30 DIAGNOSIS — Z510 Encounter for antineoplastic radiation therapy: Secondary | ICD-10-CM | POA: Diagnosis not present

## 2023-07-30 LAB — RAD ONC ARIA SESSION SUMMARY
Course Elapsed Days: 32
Plan Fractions Treated to Date: 24
Plan Prescribed Dose Per Fraction: 2.5 Gy
Plan Total Fractions Prescribed: 28
Plan Total Prescribed Dose: 70 Gy
Reference Point Dosage Given to Date: 60 Gy
Reference Point Session Dosage Given: 2.5 Gy
Session Number: 24

## 2023-08-02 ENCOUNTER — Other Ambulatory Visit: Payer: Self-pay

## 2023-08-02 ENCOUNTER — Ambulatory Visit
Admission: RE | Admit: 2023-08-02 | Discharge: 2023-08-02 | Disposition: A | Discharge status: Home / Self Care | Payer: No Typology Code available for payment source | Source: Ambulatory Visit | Attending: Radiation Oncology | Admitting: Radiation Oncology

## 2023-08-02 DIAGNOSIS — Z510 Encounter for antineoplastic radiation therapy: Secondary | ICD-10-CM | POA: Diagnosis present

## 2023-08-02 DIAGNOSIS — C61 Malignant neoplasm of prostate: Secondary | ICD-10-CM | POA: Diagnosis present

## 2023-08-02 LAB — RAD ONC ARIA SESSION SUMMARY
Course Elapsed Days: 35
Plan Fractions Treated to Date: 25
Plan Prescribed Dose Per Fraction: 2.5 Gy
Plan Total Fractions Prescribed: 28
Plan Total Prescribed Dose: 70 Gy
Reference Point Dosage Given to Date: 62.5 Gy
Reference Point Session Dosage Given: 2.5 Gy
Session Number: 25

## 2023-08-03 ENCOUNTER — Ambulatory Visit
Admission: RE | Admit: 2023-08-03 | Discharge: 2023-08-03 | Disposition: A | Discharge status: Home / Self Care | Payer: No Typology Code available for payment source | Source: Ambulatory Visit | Attending: Radiation Oncology

## 2023-08-03 ENCOUNTER — Other Ambulatory Visit: Payer: Self-pay

## 2023-08-03 DIAGNOSIS — Z510 Encounter for antineoplastic radiation therapy: Secondary | ICD-10-CM | POA: Diagnosis not present

## 2023-08-03 LAB — RAD ONC ARIA SESSION SUMMARY
Course Elapsed Days: 36
Plan Fractions Treated to Date: 26
Plan Prescribed Dose Per Fraction: 2.5 Gy
Plan Total Fractions Prescribed: 28
Plan Total Prescribed Dose: 70 Gy
Reference Point Dosage Given to Date: 65 Gy
Reference Point Session Dosage Given: 2.5 Gy
Session Number: 26

## 2023-08-04 ENCOUNTER — Ambulatory Visit
Admission: RE | Admit: 2023-08-04 | Discharge: 2023-08-04 | Disposition: A | Discharge status: Home / Self Care | Payer: No Typology Code available for payment source | Source: Ambulatory Visit | Attending: Radiation Oncology

## 2023-08-04 ENCOUNTER — Ambulatory Visit
Admission: RE | Admit: 2023-08-04 | Discharge: 2023-08-04 | Disposition: A | Discharge status: Home / Self Care | Source: Ambulatory Visit | Attending: Radiation Oncology | Admitting: Radiation Oncology

## 2023-08-04 ENCOUNTER — Other Ambulatory Visit: Payer: Self-pay

## 2023-08-04 ENCOUNTER — Ambulatory Visit: Payer: No Typology Code available for payment source

## 2023-08-04 DIAGNOSIS — Z510 Encounter for antineoplastic radiation therapy: Secondary | ICD-10-CM | POA: Diagnosis not present

## 2023-08-04 LAB — RAD ONC ARIA SESSION SUMMARY
Course Elapsed Days: 37
Plan Fractions Treated to Date: 27
Plan Prescribed Dose Per Fraction: 2.5 Gy
Plan Total Fractions Prescribed: 28
Plan Total Prescribed Dose: 70 Gy
Reference Point Dosage Given to Date: 67.5 Gy
Reference Point Session Dosage Given: 2.5 Gy
Session Number: 27

## 2023-08-05 ENCOUNTER — Other Ambulatory Visit: Payer: Self-pay

## 2023-08-05 ENCOUNTER — Ambulatory Visit
Admission: RE | Admit: 2023-08-05 | Discharge: 2023-08-05 | Disposition: A | Discharge status: Home / Self Care | Payer: No Typology Code available for payment source | Source: Ambulatory Visit | Attending: Radiation Oncology | Admitting: Radiation Oncology

## 2023-08-05 ENCOUNTER — Ambulatory Visit: Payer: No Typology Code available for payment source

## 2023-08-05 DIAGNOSIS — C61 Malignant neoplasm of prostate: Secondary | ICD-10-CM

## 2023-08-05 DIAGNOSIS — Z510 Encounter for antineoplastic radiation therapy: Secondary | ICD-10-CM | POA: Diagnosis not present

## 2023-08-05 LAB — RAD ONC ARIA SESSION SUMMARY
Course Elapsed Days: 38
Plan Fractions Treated to Date: 28
Plan Prescribed Dose Per Fraction: 2.5 Gy
Plan Total Fractions Prescribed: 28
Plan Total Prescribed Dose: 70 Gy
Reference Point Dosage Given to Date: 70 Gy
Reference Point Session Dosage Given: 2.5 Gy
Session Number: 28

## 2023-08-06 NOTE — Radiation Completion Notes (Addendum)
  Radiation Oncology         [phone] ________________________________  Name: Connor Stevens MRN: [id number]  Date: 08/05/2023  DOB: 03-27-1951  Referring Physician: Rhoderick Moody, M.D. Date of Service: 2023-08-06 Radiation Oncologist: Margaretmary Bayley, M.D. [facility]     RADIATION ONCOLOGY END OF TREATMENT NOTE     Diagnosis: 73 y.o. gentleman with Stage T1c adenocarcinoma of the prostate with Gleason score of 3+4, and PSA of 17.2.   Intent: Curative     ==========DELIVERED PLANS==========  First Treatment Date: 2023-06-28 Last Treatment Date: 2023-08-05   Plan Name: Prostate Site: Prostate Technique: IMRT Mode: Photon Dose Per Fraction: 2.5 Gy Prescribed Dose (Delivered / Prescribed): 70 Gy / 70 Gy Prescribed Fxs (Delivered / Prescribed): 28 / 28     ==========ON TREATMENT VISIT DATES========== 2023-07-05, 2023-07-09, 2023-07-16, 2023-07-23, 2023-07-30, 2023-08-04    See weekly On Treatment Notes in Epic for details in the Media tab (listed as Progress notes on the On Treatment Visit Dates listed above).  He tolerated the daily radiation treatments well, with only modest fatigue.  The patient will receive a call in about one month from the radiation oncology department. He will continue follow up with his urologist, Dr. Liliane Shi, as well.  ------------------------------------------------   Margaretmary Dys, MD [facility]  Radiation Oncology Direct Dial: [phone]  Fax: [phone] [email or web address]  Skype  LinkedIn

## 2023-08-06 NOTE — Progress Notes (Signed)
 Patient was a RadOnc Consult on 03/05/23 for his stage T1c adenocarcinoma of the prostate with Gleason score of 3+4, and PSA of 17.2. Patient proceed with treatment recommendations of 5.5 weeks of external radiation and had his final radiation treatment on 08/05/23.   Patient is scheduled for a post treatment nurse call on 4/8 and has his first post treatment PSA on 3/26 at [facility].    RN spoke with patient and provided education on post treatment PSA monitoring.  All questions answered, no additional needs at this time.

## 2023-09-07 ENCOUNTER — Ambulatory Visit
Admission: RE | Admit: 2023-09-07 | Discharge: 2023-09-07 | Disposition: A | Discharge status: Home / Self Care | Source: Ambulatory Visit

## 2023-09-07 NOTE — Progress Notes (Signed)
  Radiation Oncology         [phone] ________________________________  Name: Connor Stevens MRN: [id number]  Date of Service: 09/07/2023  DOB: [birth year]  Post Treatment Telephone Note  Diagnosis:  C61 Malignant neoplasm of prostate (as documented in provider EOT note)  Pre Treatment IPSS Score: 21 (as documented in the provider consult note)  The patient was available for call today.   Patient reports moderate bilateral leg weakness w/ exertion.  Symptoms of fatigue have improved since completing therapy.  Symptoms of bladder changes have improved since completing therapy. Current symptoms include polyuria, and medications for bladder symptoms include Tamsulosin.  Symptoms of bowel changes have improved since completing therapy. Current symptoms include none, and medications for bowel symptoms include none.   Post Treatment IPSS Score: IPSS Questionnaire (AUA-7): Over the past month.   1)  How often have you had a sensation of not emptying your bladder completely after you finish urinating?  3 - About half the time  2)  How often have you had to urinate again less than two hours after you finished urinating? 3 - About half the time  3)  How often have you found you stopped and started again several times when you urinated?  0 - Not at all  4) How difficult have you found it to postpone urination?  2 - Less than half the time  5) How often have you had a weak urinary stream?  2 - Less than half the time  6) How often have you had to push or strain to begin urination?  0 - Not at all  7) How many times did you most typically get up to urinate from the time you went to bed until the time you got up in the morning?  2 - 2 times  Total score:  12. Which indicates moderate symptoms  0-7 mildly symptomatic   8-19 moderately symptomatic   20-35 severely symptomatic   Patient has a scheduled follow up visit with his urologist, Dr. Liliane Shi, on 01/11/2024 for ongoing surveillance. He was  counseled that PSA levels will be drawn in the urology office, and was reassured that additional time is expected to improve bowel and bladder symptoms. He was encouraged to call back with concerns or questions regarding radiation.   This concludes the interaction.  Ruel Favors, LPN

## 2023-09-10 ENCOUNTER — Other Ambulatory Visit: Payer: Self-pay | Admitting: Urology

## 2023-09-10 DIAGNOSIS — C61 Malignant neoplasm of prostate: Secondary | ICD-10-CM

## 2023-09-14 ENCOUNTER — Encounter: Payer: Self-pay | Admitting: *Deleted

## 2023-10-19 ENCOUNTER — Encounter: Payer: Self-pay | Admitting: *Deleted

## 2023-10-19 NOTE — Progress Notes (Signed)
 SCP summary completed and mailed to patient.

## 2023-10-22 ENCOUNTER — Inpatient Hospital Stay: Attending: Internal Medicine | Admitting: *Deleted

## 2023-10-22 ENCOUNTER — Encounter: Payer: Self-pay | Admitting: *Deleted

## 2023-10-22 DIAGNOSIS — C61 Malignant neoplasm of prostate: Secondary | ICD-10-CM

## 2023-10-22 NOTE — Progress Notes (Signed)
SCP reviewed and completed. 

## 2023-12-09 IMAGING — MR MR PROSTATE WO/W CM
13 series · 48 of 48 positions shown · IV contrast (7ml GADAVIST)
Comparison: None.

CLINICAL DATA: Elevated PSA.

EXAM:
MR PROSTATE WITHOUT AND WITH CONTRAST
TECHNIQUE: Multiplanar multisequence MRI images were obtained of the pelvis
centered about the prostate. Pre and post contrast images were
obtained.
CONTRAST:  7mL GADAVIST GADOBUTROL 1 MMOL/ML IV SOLN

[Series 3: T1 · axial · 5.0mm · 1.19mm/px · z∈[-150,+205]mm · 2 of 72 slices shown (1 of 2)]
[im 1/72]
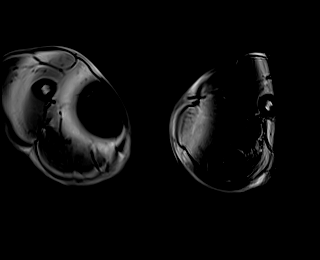
[im 72/72]
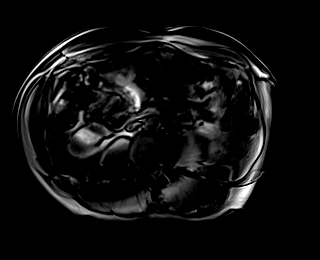

[Series 4: T1 · axial · 5.0mm · 1.19mm/px · z∈[-150,+205]mm · 2 of 72 slices shown (2 of 2)]
[im 1/72]
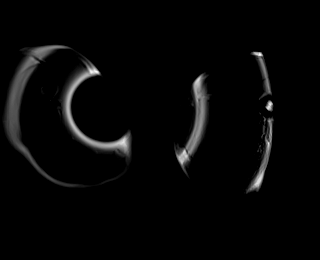
[im 72/72]
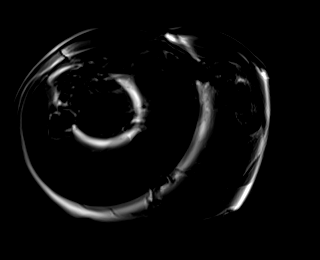

[Series 5: T2 · axial · 3.0mm · 0.47mm/px · 1 of 28 slices shown (1 of 3)]
[im 1/28]
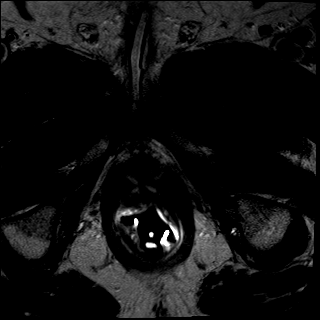

[Series 6: T2 · coronal · 3.0mm · 0.47mm/px · 1 of 24 slices shown (2 of 3)]
[im 1/24]
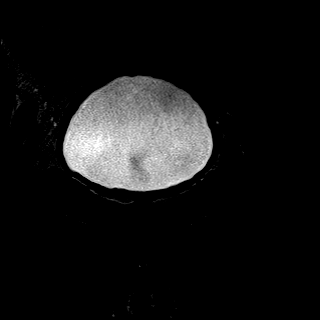

[Series 7: T2 · axial · 1.0mm · 1.00mm/px · z∈[-24,+47]mm · 2 of 72 slices shown (3 of 3)]
[im 1/72]
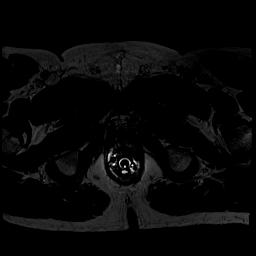
[im 72/72]
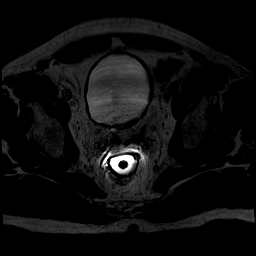

[Series 8: ep2d_diff_b100_500_800_tra_endo**_tracew_dfc_mix · axial · 3.0mm · 1.60mm/px · z∈[-31,+44]mm · 2 of 78 slices shown]
[im 1/78]
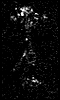
[im 78/78]
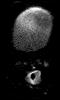

[Series 9: ep2d_diff_b100_500_800_tra_endo**_adc_dfc_mix · axial · 3.0mm · 1.60mm/px · 1 of 26 slices shown]
[im 1/26]
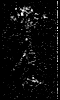

[Series 10: ep2d_diff_b100_500_800_tra_endo**_calc_bval_dfc_mix · axial · 3.0mm · 1.60mm/px · 1 of 23 slices shown]
[im 1/23]
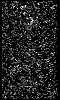

[Series 11: ep2d_diff_bvalue (id) · axial · 3.0mm · 1.60mm/px · 1 of 28 slices shown]
[im 1/28]
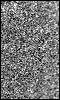

[Series 12: axial multiphase · axial · 3.0mm · 0.98mm/px · z∈[-27,+54]mm · 16 of 560 slices shown]
[im 1/560]
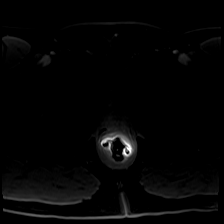
[im 38/560]
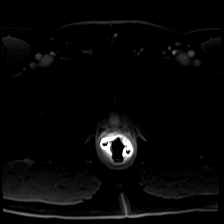
[im 75/560]
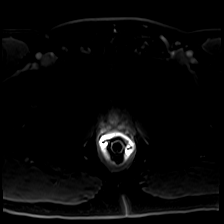
[im 112/560]
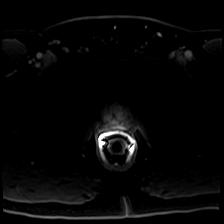
[im 150/560]
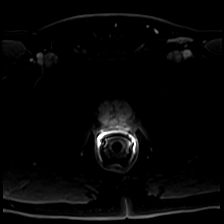
[im 187/560]
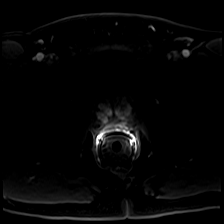
[im 224/560]
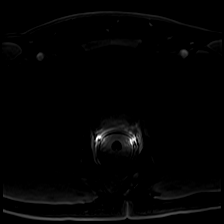
[im 261/560]
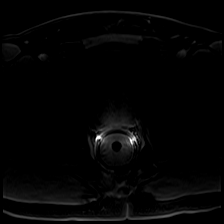
[im 299/560]
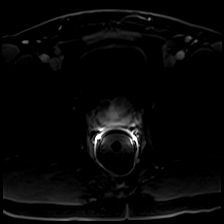
[im 336/560]
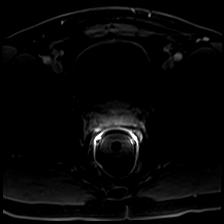
[im 373/560]
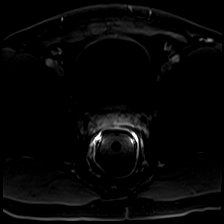
[im 410/560]
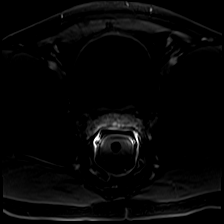
[im 448/560]
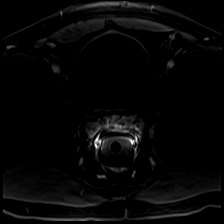
[im 485/560]
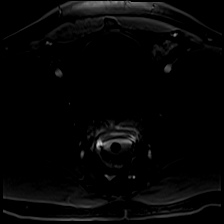
[im 522/560]
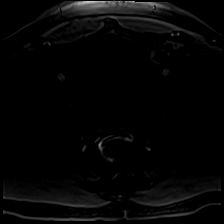
[im 560/560]
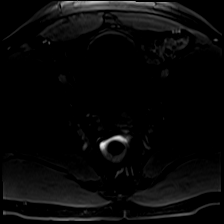

[Series 13: axial multiphase_sub · axial · 3.0mm · 0.98mm/px · z∈[-27,+54]mm · 15 of 532 slices shown]
[im 1/532]
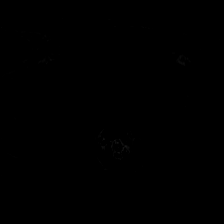
[im 38/532]
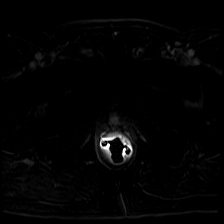
[im 76/532]
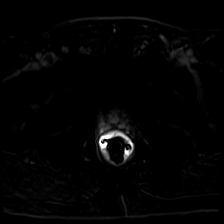
[im 114/532]
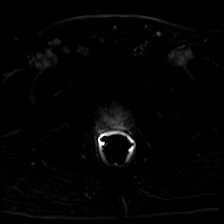
[im 152/532]
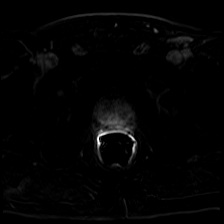
[im 190/532]
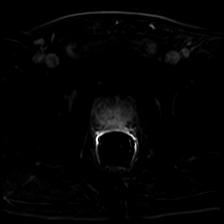
[im 228/532]
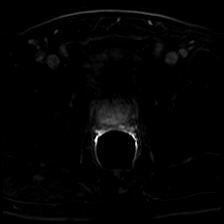
[im 266/532]
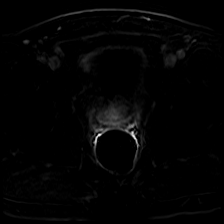
[im 304/532]
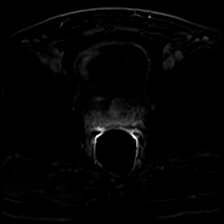
[im 342/532]
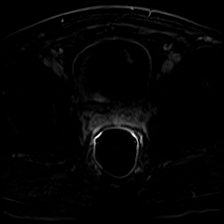
[im 380/532]
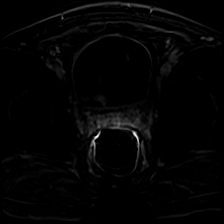
[im 418/532]
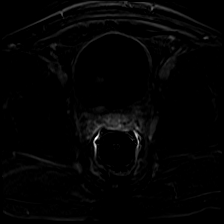
[im 456/532]
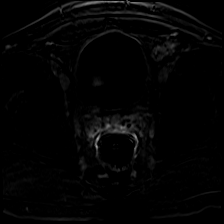
[im 494/532]
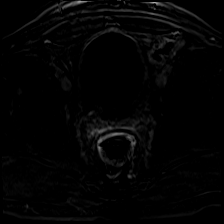
[im 532/532]
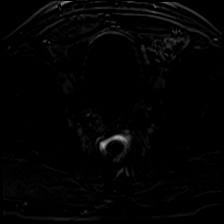

[Series 15: iliac crest thru · axial · 2.5mm · 1.19mm/px · z∈[-70,+127]mm · 2 of 80 slices shown]
[im 1/80]
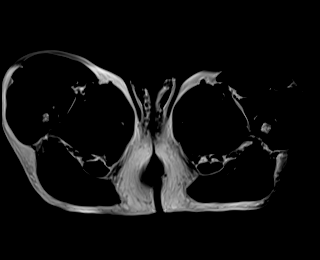
[im 80/80]
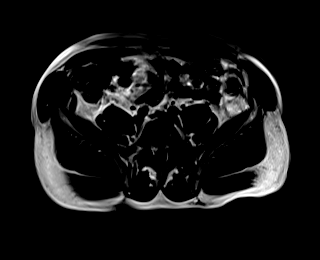

[Series 100: sub_ · axial · 2.5mm · 1.19mm/px · z∈[-70,+127]mm · 2 of 80 slices shown]
[im 1/80]
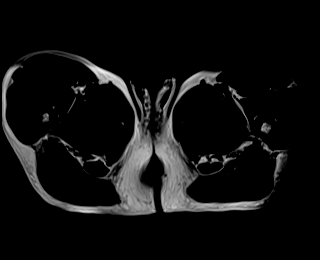
[im 80/80]
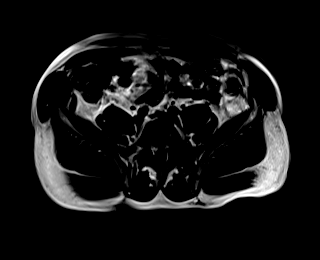

[48 of 48 positions shown; findings below may reference images not displayed]

FINDINGS: Prostate:

-- Peripheral Zone: Linear/wedge shaped hypointensities are noted on
ADC. A 9 x 6 mm T2 hypointense lesion is seen in the left
posteromedial mid gland, which shows focal mild-to-moderate ADC
hypointensity, but no evidence of DWI hyperintensity or early focal
contrast enhancement. PI-RADS 3

-- Transition/Central Zone: Mildly enlarged with involvement by BPH
nodules. No nodules with suspicious characteristics on T2-weighted
or diffusion imaging.

-- Measurements/Volume:  4.6 by 3.7 x 4.8 cm (volume = 43 cm^3)

Transcapsular spread:  Absent

Seminal vesicle involvement:  Absent

Neurovascular bundle involvement:  Absent

Pelvic adenopathy: None visualized

Bone metastasis: None visualized

Other:  None
IMPRESSION: 9 mm peripheral zone lesion in the left posteromedial mid gland.
PI-RADS 3 (v2.1): Intermediate (clinically significant cancer
equivocal)

No evidence of extracapsular extension or pelvic metastatic disease.

(I have post-processed this exam in the DynaCAD application for
potential fusion-guided biopsy.)

## 2024-03-23 ENCOUNTER — Ambulatory Visit (HOSPITAL_COMMUNITY)
Admission: RE | Admit: 2024-03-23 | Discharge: 2024-03-23 | Disposition: A | Discharge status: Home / Self Care | Source: Ambulatory Visit

## 2024-03-23 ENCOUNTER — Other Ambulatory Visit (HOSPITAL_COMMUNITY): Payer: Self-pay

## 2024-03-23 DIAGNOSIS — R002 Palpitations: Secondary | ICD-10-CM | POA: Insufficient documentation

## 2024-03-23 DIAGNOSIS — R0609 Other forms of dyspnea: Secondary | ICD-10-CM | POA: Diagnosis present

## 2024-03-23 LAB — ECHOCARDIOGRAM COMPLETE
Area-P 1/2: 5.13 cm2
S' Lateral: 2.51 cm
